# Patient Record
Sex: Female | Born: 1990 | Race: White | Hispanic: No | Marital: Single | Smoking: Former smoker
Health system: Southern US, Community
[De-identification: ages and names within clinical notes are randomized; demographics above are authoritative.]

## PROBLEM LIST (undated history)

## (undated) DIAGNOSIS — F329 Major depressive disorder, single episode, unspecified: Secondary | ICD-10-CM

## (undated) DIAGNOSIS — K5900 Constipation, unspecified: Secondary | ICD-10-CM

## (undated) DIAGNOSIS — M549 Dorsalgia, unspecified: Secondary | ICD-10-CM

## (undated) DIAGNOSIS — M7989 Other specified soft tissue disorders: Secondary | ICD-10-CM

## (undated) DIAGNOSIS — F419 Anxiety disorder, unspecified: Secondary | ICD-10-CM

## (undated) DIAGNOSIS — I10 Essential (primary) hypertension: Secondary | ICD-10-CM

## (undated) DIAGNOSIS — M2550 Pain in unspecified joint: Secondary | ICD-10-CM

## (undated) DIAGNOSIS — J45909 Unspecified asthma, uncomplicated: Secondary | ICD-10-CM

## (undated) DIAGNOSIS — R12 Heartburn: Secondary | ICD-10-CM

## (undated) DIAGNOSIS — F32A Depression, unspecified: Secondary | ICD-10-CM

## (undated) HISTORY — DX: Heartburn: R12

## (undated) HISTORY — DX: Unspecified asthma, uncomplicated: J45.909

## (undated) HISTORY — DX: Dorsalgia, unspecified: M54.9

## (undated) HISTORY — DX: Other specified soft tissue disorders: M79.89

## (undated) HISTORY — DX: Pain in unspecified joint: M25.50

## (undated) HISTORY — DX: Constipation, unspecified: K59.00

## (undated) HISTORY — DX: Essential (primary) hypertension: I10

---

## 2012-11-18 ENCOUNTER — Emergency Department (HOSPITAL_COMMUNITY)
Admission: EM | Admit: 2012-11-18 | Discharge: 2012-11-18 | Disposition: A | Discharge status: Home / Self Care | Payer: BC Managed Care – PPO | Attending: Emergency Medicine | Admitting: Emergency Medicine

## 2012-11-18 ENCOUNTER — Encounter (HOSPITAL_COMMUNITY): Payer: Self-pay | Admitting: Emergency Medicine

## 2012-11-18 ENCOUNTER — Emergency Department (HOSPITAL_COMMUNITY): Payer: BC Managed Care – PPO

## 2012-11-18 DIAGNOSIS — S298XXA Other specified injuries of thorax, initial encounter: Secondary | ICD-10-CM | POA: Insufficient documentation

## 2012-11-18 DIAGNOSIS — Z87891 Personal history of nicotine dependence: Secondary | ICD-10-CM | POA: Diagnosis not present

## 2012-11-18 DIAGNOSIS — R079 Chest pain, unspecified: Secondary | ICD-10-CM

## 2012-11-18 DIAGNOSIS — IMO0002 Reserved for concepts with insufficient information to code with codable children: Secondary | ICD-10-CM | POA: Insufficient documentation

## 2012-11-18 DIAGNOSIS — Y92410 Unspecified street and highway as the place of occurrence of the external cause: Secondary | ICD-10-CM | POA: Insufficient documentation

## 2012-11-18 DIAGNOSIS — Y9389 Activity, other specified: Secondary | ICD-10-CM | POA: Insufficient documentation

## 2012-11-18 DIAGNOSIS — S0990XA Unspecified injury of head, initial encounter: Secondary | ICD-10-CM | POA: Insufficient documentation

## 2012-11-18 DIAGNOSIS — M549 Dorsalgia, unspecified: Secondary | ICD-10-CM

## 2012-11-18 MED ORDER — IBUPROFEN 600 MG PO TABS: 600.0000 mg | ORAL_TABLET | Freq: Three times a day (TID) | ORAL | Status: DC | PRN

## 2012-11-18 MED ORDER — HYDROCODONE-ACETAMINOPHEN 5-325 MG PO TABS
1.0000 | ORAL_TABLET | Freq: Once | ORAL | Status: AC
  Administered 2012-11-18: 1 via ORAL
  Filled 2012-11-18: qty 1

## 2012-11-18 MED ORDER — HYDROCODONE-ACETAMINOPHEN 5-325 MG PO TABS: 1.0000 | ORAL_TABLET | Freq: Four times a day (QID) | ORAL | Status: DC | PRN
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
## 2012-11-18 NOTE — ED Notes (Signed)
C-collar removed by Dr. Patria Mane after his exam.
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
## 2012-11-18 NOTE — ED Notes (Signed)
Patient states that her head was hurting, but she is feeling ok,

## 2012-11-18 NOTE — ED Notes (Signed)
Pt ambulated to nurses station with no distress.

## 2016-04-24 ENCOUNTER — Emergency Department (HOSPITAL_COMMUNITY): Payer: Self-pay

## 2016-04-24 ENCOUNTER — Emergency Department (HOSPITAL_COMMUNITY)
Admission: EM | Admit: 2016-04-24 | Discharge: 2016-04-24 | Disposition: A | Discharge status: Home / Self Care | Payer: Self-pay | Attending: Emergency Medicine | Admitting: Emergency Medicine

## 2016-04-24 ENCOUNTER — Encounter (HOSPITAL_COMMUNITY): Payer: Self-pay | Admitting: Emergency Medicine

## 2016-04-24 DIAGNOSIS — R197 Diarrhea, unspecified: Secondary | ICD-10-CM

## 2016-04-24 DIAGNOSIS — Z87891 Personal history of nicotine dependence: Secondary | ICD-10-CM | POA: Insufficient documentation

## 2016-04-24 DIAGNOSIS — Z79899 Other long term (current) drug therapy: Secondary | ICD-10-CM | POA: Insufficient documentation

## 2016-04-24 DIAGNOSIS — K529 Noninfective gastroenteritis and colitis, unspecified: Secondary | ICD-10-CM | POA: Insufficient documentation

## 2016-04-24 DIAGNOSIS — R112 Nausea with vomiting, unspecified: Secondary | ICD-10-CM

## 2016-04-24 HISTORY — DX: Depression, unspecified: F32.A

## 2016-04-24 HISTORY — DX: Anxiety disorder, unspecified: F41.9

## 2016-04-24 HISTORY — DX: Major depressive disorder, single episode, unspecified: F32.9

## 2016-04-24 LAB — COMPREHENSIVE METABOLIC PANEL
ALT: 23 U/L (ref 14–54)
AST: 21 U/L (ref 15–41)
Albumin: 4.7 g/dL (ref 3.5–5.0)
Alkaline Phosphatase: 43 U/L (ref 38–126)
Anion gap: 11 (ref 5–15)
BUN: 14 mg/dL (ref 6–20)
CHLORIDE: 103 mmol/L (ref 101–111)
CO2: 21 mmol/L — ABNORMAL LOW (ref 22–32)
CREATININE: 0.66 mg/dL (ref 0.44–1.00)
Calcium: 9 mg/dL (ref 8.9–10.3)
Glucose, Bld: 109 mg/dL — ABNORMAL HIGH (ref 65–99)
Potassium: 3.7 mmol/L (ref 3.5–5.1)
Sodium: 135 mmol/L (ref 135–145)
Total Bilirubin: 1.3 mg/dL — ABNORMAL HIGH (ref 0.3–1.2)
Total Protein: 7.7 g/dL (ref 6.5–8.1)

## 2016-04-24 LAB — CBC WITH DIFFERENTIAL/PLATELET
Basophils Absolute: 0 10*3/uL (ref 0.0–0.1)
Basophils Relative: 0 %
Eosinophils Absolute: 0 10*3/uL (ref 0.0–0.7)
Eosinophils Relative: 0 %
HEMATOCRIT: 38.1 % (ref 36.0–46.0)
Hemoglobin: 13.3 g/dL (ref 12.0–15.0)
LYMPHS ABS: 0.7 10*3/uL (ref 0.7–4.0)
Lymphocytes Relative: 5 %
MCH: 28.4 pg (ref 26.0–34.0)
MCHC: 34.9 g/dL (ref 30.0–36.0)
MCV: 81.2 fL (ref 78.0–100.0)
MONOS PCT: 4 %
Monocytes Absolute: 0.6 10*3/uL (ref 0.1–1.0)
NEUTROS ABS: 13.1 10*3/uL — AB (ref 1.7–7.7)
Neutrophils Relative %: 91 %
Platelets: 230 10*3/uL (ref 150–400)
RBC: 4.69 MIL/uL (ref 3.87–5.11)
RDW: 12.5 % (ref 11.5–15.5)
WBC: 14.5 10*3/uL — ABNORMAL HIGH (ref 4.0–10.5)

## 2016-04-24 LAB — URINALYSIS, ROUTINE W REFLEX MICROSCOPIC
BILIRUBIN URINE: NEGATIVE
GLUCOSE, UA: NEGATIVE mg/dL
Hgb urine dipstick: NEGATIVE
Ketones, ur: NEGATIVE mg/dL
Leukocytes, UA: NEGATIVE
Nitrite: NEGATIVE
Protein, ur: NEGATIVE mg/dL
Specific Gravity, Urine: 1.025 (ref 1.005–1.030)
pH: 6 (ref 5.0–8.0)

## 2016-04-24 LAB — LIPASE, BLOOD: Lipase: 27 U/L (ref 11–51)

## 2016-04-24 LAB — PREGNANCY, URINE: Preg Test, Ur: NEGATIVE

## 2016-04-24 MED ORDER — SODIUM CHLORIDE 0.9 % IV BOLUS (SEPSIS)
1000.0000 mL | Freq: Once | INTRAVENOUS | Status: AC
  Administered 2016-04-24: 1000 mL via INTRAVENOUS

## 2016-04-24 MED ORDER — ONDANSETRON HCL 4 MG/2ML IJ SOLN
4.0000 mg | INTRAMUSCULAR | Status: DC | PRN
  Administered 2016-04-24: 4 mg via INTRAVENOUS
  Filled 2016-04-24: qty 2

## 2016-04-24 MED ORDER — PROMETHAZINE HCL 25 MG PO TABS: 25.0000 mg | ORAL_TABLET | Freq: Four times a day (QID) | ORAL | 0 refills | Status: DC | PRN

## 2016-04-24 MED ORDER — IOPAMIDOL (ISOVUE-300) INJECTION 61%
100.0000 mL | Freq: Once | INTRAVENOUS | Status: AC | PRN
  Administered 2016-04-24: 100 mL via INTRAVENOUS

## 2016-04-24 MED ORDER — FAMOTIDINE IN NACL 20-0.9 MG/50ML-% IV SOLN
20.0000 mg | Freq: Once | INTRAVENOUS | Status: AC
  Administered 2016-04-24: 20 mg via INTRAVENOUS
  Filled 2016-04-24: qty 50

## 2016-04-24 MED ORDER — METRONIDAZOLE 500 MG PO TABS: 500.0000 mg | ORAL_TABLET | Freq: Three times a day (TID) | ORAL | 0 refills | Status: DC

## 2016-04-24 MED ORDER — CIPROFLOXACIN HCL 500 MG PO TABS: 500.0000 mg | ORAL_TABLET | Freq: Two times a day (BID) | ORAL | 0 refills | Status: DC

## 2016-04-24 NOTE — ED Provider Notes (Signed)
[facility] Provider Note   CSN: [id number] Arrival date & time: 04/24/16  1613     History   Chief Complaint Chief Complaint  Patient presents with  . Abdominal Pain    HPI Margaret Walton is a 26 y.o. female.  HPI  Pt was seen at 1725. Per pt, c/o gradual onset and persistence of multiple intermittent episodes of N/V/D that began this morning. Has been associated with generalized abd and low back "aching" pain.  Describes the stools as "watery." Denies CP/SOB, no back pain, no fevers, no black or blood in stools or emesis.    Past Medical History:  Diagnosis Date  . Anxiety   . Depression     There are no active problems to display for this patient.   History reviewed. No pertinent surgical history.  OB History    Gravida Para Term Preterm AB Living   0 0 0 0 0 0   SAB TAB Ectopic Multiple Live Births   0 0 0 0 0       Home Medications    Prior to Admission medications   Medication Sig Start Date End Date Taking? Authorizing Provider  Drospirenone-Ethinyl Estradiol-Levomefol (BEYAZ) 3-0.02-0.451 MG tablet Take 1 tablet by mouth daily.    Historical Provider, MD  FLUoxetine (PROZAC) 40 MG capsule Take 40 mg by mouth daily.    Historical Provider, MD  HYDROcodone-acetaminophen (NORCO/VICODIN) 5-325 MG per tablet Take 1 tablet by mouth every 6 (six) hours as needed for moderate pain. 11/18/12   Azalia Bilis, MD  ibuprofen (ADVIL,MOTRIN) 200 MG tablet Take 800 mg by mouth daily as needed for mild pain.    Historical Provider, MD  ibuprofen (ADVIL,MOTRIN) 600 MG tablet Take 1 tablet (600 mg total) by mouth every 8 (eight) hours as needed. 11/18/12   Azalia Bilis, MD  pantoprazole (PROTONIX) 40 MG tablet Take 40 mg by mouth daily.    Historical Provider, MD    Family History History reviewed. No pertinent family history.  Social History Social History  Substance Use Topics  . Smoking status: Former Games developer  . Smokeless tobacco: Never Used  .  Alcohol use No     Allergies   Augmentin [amoxicillin-pot clavulanate] and Morphine and related   Review of Systems Review of Systems ROS: Statement: All systems negative except as marked or noted in the HPI; Constitutional: Negative for fever and chills. ; ; Eyes: Negative for eye pain, redness and discharge. ; ; ENMT: Negative for ear pain, hoarseness, nasal congestion, sinus pressure and sore throat. ; ; Cardiovascular: Negative for chest pain, palpitations, diaphoresis, dyspnea and peripheral edema. ; ; Respiratory: Negative for cough, wheezing and stridor. ; ; Gastrointestinal: +N/V/D, abd pain. Negative for blood in stool, hematemesis, jaundice and rectal bleeding. . ; ; Genitourinary: Negative for dysuria, flank pain and hematuria. ; ; Musculoskeletal: Negative for back pain and neck pain. Negative for swelling and trauma.; ; Skin: Negative for pruritus, rash, abrasions, blisters, bruising and skin lesion.; ; Neuro: Negative for headache, lightheadedness and neck stiffness. Negative for weakness, altered level of consciousness, altered mental status, extremity weakness, paresthesias, involuntary movement, seizure and syncope.       Physical Exam Updated Vital Signs BP 109/62   Pulse (!) 116   Temp 98.6 F (37 C) (Oral)   Resp 18   Ht  (1.575 m)   Wt 165 lb (74.8 kg)   LMP 04/03/2016   SpO2 98%   BMI 30.18 kg/m   Patient  Vitals for the past 24 hrs:  BP Temp Temp src Pulse Resp SpO2 Height Weight  04/24/16 2140 - 99.2 F (37.3 C) Oral (!) 113 - 100 % - -  04/24/16 2132 (!) 98/54 - - - - - - -  04/24/16 2121 - - - - - 99 % - -  04/24/16 2112 - - - (!) 105 - 90 % - -  04/24/16 2100 - - - (!) 109 - 100 % - -  04/24/16 2004 (!) 105/59 - - (!) 115 - 100 % - -  04/24/16 1944 101/60 - - (!) 116 16 100 % - -  04/24/16 1930 (!) 99/49 - - (!) 112 - 100 % - -  04/24/16 1900 (!) 99/52 - - (!) 105 - 100 % - -  04/24/16 1800 - - - 95 - 100 % - -  04/24/16 1628 - - - - - -   (1.575 m) 165 lb (74.8 kg)  04/24/16 1627 109/62 98.6 F (37 C) Oral (!) 116 18 98 % - -     Physical Exam 1730: Physical examination:  Nursing notes reviewed; Vital signs and O2 SAT reviewed;  Constitutional: Well developed, Well nourished, Well hydrated, Uncomfortable appearing.; Head:  Normocephalic, atraumatic; Eyes: EOMI, PERRL, No scleral icterus; ENMT: Mouth and pharynx normal, Mucous membranes moist; Neck: Supple, Full range of motion, No lymphadenopathy; Cardiovascular: Tachycardic rate and rhythm, No gallop; Respiratory: Breath sounds clear & equal bilaterally, No wheezes.  Speaking full sentences with ease, Normal respiratory effort/excursion; Chest: Nontender, Movement normal; Abdomen: Soft, +generalized tenderness to palp. No rebound or guarding. Nondistended, Normal bowel sounds; Genitourinary: No CVA tenderness; Extremities: Pulses normal, No tenderness, No edema, No calf edema or asymmetry.; Neuro: AA&Ox3, Major CN grossly intact.  Speech clear. No gross focal motor or sensory deficits in extremities.; Skin: Color normal, Warm, Dry.   ED Treatments / Results  Labs (all labs ordered are listed, but only abnormal results are displayed)   EKG  EKG Interpretation None       Radiology   Procedures Procedures (including critical care time)  Medications Ordered in ED Medications  sodium chloride 0.9 % bolus 1,000 mL (not administered)  famotidine (PEPCID) IVPB 20 mg premix (not administered)  ondansetron (ZOFRAN) injection 4 mg (not administered)     Initial Impression / Assessment and Plan / ED Course  I have reviewed the triage vital signs and the nursing notes.  Pertinent labs & imaging results that were available during my care of the patient were reviewed by me and considered in my medical decision making (see chart for details).  MDM Reviewed: previous chart, nursing note and vitals Reviewed previous: labs Interpretation: labs and CT scan    Results for  orders placed or performed during the hospital encounter of 04/24/16  Lipase, blood  Result Value Ref Range   Lipase 27 11 - 51 U/L  Comprehensive metabolic panel  Result Value Ref Range   Sodium 135 135 - 145 mmol/L   Potassium 3.7 3.5 - 5.1 mmol/L   Chloride 103 101 - 111 mmol/L   CO2 21 (L) 22 - 32 mmol/L   Glucose, Bld 109 (H) 65 - 99 mg/dL   BUN 14 6 - 20 mg/dL   Creatinine, Ser 0.98 0.44 - 1.00 mg/dL   Calcium 9.0 8.9 - 11.9 mg/dL   Total Protein 7.7 6.5 - 8.1 g/dL   Albumin 4.7 3.5 - 5.0 g/dL   AST 21 15 - 41 U/L  ALT 23 14 - 54 U/L   Alkaline Phosphatase 43 38 - 126 U/L   Total Bilirubin 1.3 (H) 0.3 - 1.2 mg/dL   GFR calc non Af Amer >60 >60 mL/min   GFR calc Af Amer >60 >60 mL/min   Anion gap 11 5 - 15  Urinalysis, Routine w reflex microscopic  Result Value Ref Range   Color, Urine YELLOW YELLOW   APPearance CLOUDY (A) CLEAR   Specific Gravity, Urine 1.025 1.005 - 1.030   pH 6.0 5.0 - 8.0   Glucose, UA NEGATIVE NEGATIVE mg/dL   Hgb urine dipstick NEGATIVE NEGATIVE   Bilirubin Urine NEGATIVE NEGATIVE   Ketones, ur NEGATIVE NEGATIVE mg/dL   Protein, ur NEGATIVE NEGATIVE mg/dL   Nitrite NEGATIVE NEGATIVE   Leukocytes, UA NEGATIVE NEGATIVE  Pregnancy, urine  Result Value Ref Range   Preg Test, Ur NEGATIVE NEGATIVE  CBC with Differential  Result Value Ref Range   WBC 14.5 (H) 4.0 - 10.5 K/uL   RBC 4.69 3.87 - 5.11 MIL/uL   Hemoglobin 13.3 12.0 - 15.0 g/dL   HCT 16.1 09.6 - 04.5 %   MCV 81.2 78.0 - 100.0 fL   MCH 28.4 26.0 - 34.0 pg   MCHC 34.9 30.0 - 36.0 g/dL   RDW 40.9 81.1 - 91.4 %   Platelets 230 150 - 400 K/uL   Neutrophils Relative % 91 %   Neutro Abs 13.1 (H) 1.7 - 7.7 K/uL   Lymphocytes Relative 5 %   Lymphs Abs 0.7 0.7 - 4.0 K/uL   Monocytes Relative 4 %   Monocytes Absolute 0.6 0.1 - 1.0 K/uL   Eosinophils Relative 0 %   Eosinophils Absolute 0.0 0.0 - 0.7 K/uL   Basophils Relative 0 %   Basophils Absolute 0.0 0.0 - 0.1 K/uL   Ct Abdomen  Pelvis W Contrast Result Date: 04/24/2016 CLINICAL DATA:  Acute onset of nausea, vomiting and diarrhea. Initial encounter. EXAM: CT ABDOMEN AND PELVIS WITH CONTRAST TECHNIQUE: Multidetector CT imaging of the abdomen and pelvis was performed using the standard protocol following bolus administration of intravenous contrast. CONTRAST:  ISOVUE-300 IOPAMIDOL (ISOVUE-300) INJECTION 61% COMPARISON:  None. FINDINGS: Lower chest: The visualized lung bases are grossly clear. The visualized portions of the mediastinum are unremarkable. Hepatobiliary: The liver is unremarkable in appearance. The gallbladder is unremarkable in appearance. The common bile duct remains normal in caliber. Pancreas: The pancreas is within normal limits. Spleen: The spleen is enlarged, measuring 14.7 cm in length. Adrenals/Urinary Tract: The adrenal glands are unremarkable in appearance. The kidneys are within normal limits. There is no evidence of hydronephrosis. No renal or ureteral stones are identified. No perinephric stranding is seen. Stomach/Bowel: The stomach is unremarkable in appearance. The small bowel is within normal limits. The appendix is normal in caliber, without evidence of appendicitis. There is suggestion of mild wall thickening along the the sigmoid colon, which may reflect a mild infectious or inflammatory process. The colon is otherwise unremarkable. Vascular/Lymphatic: The abdominal aorta is unremarkable in appearance. The inferior vena cava is grossly unremarkable. No retroperitoneal lymphadenopathy is seen. No pelvic sidewall lymphadenopathy is identified. Reproductive: The bladder is mildly distended and grossly unremarkable. The uterus is grossly unremarkable in appearance. Right adnexal cysts measure up to 2.8 cm in size, likely physiologic in nature given the patient's age. The left ovary is unremarkable in appearance. Other: No additional soft tissue abnormalities are seen. Musculoskeletal: No acute osseous  abnormalities are identified. The visualized musculature is unremarkable in  appearance. IMPRESSION: 1. Suggestion of mild wall thickening along the sigmoid colon, which may reflect a mild infectious or inflammatory process. 2. Splenomegaly noted. Electronically Signed   By: Roanna Raider M.D.   On: 04/24/2016 18:52    2150:  Pt has tol PO well while in the ED without N/V.  No stooling while in the ED.  Abd benign, resps easy. Pt states she feels better and wants to go home now. Unknown pt's baseline VS. Denies CP/palpitations/SOB. Doubt PE without c/o SOB and with low risk Well's. Will tx abx, f/u PMD. Dx and testing d/w pt and family.  Questions answered.  Verb understanding, agreeable to d/c home with outpt f/u.     Final Clinical Impressions(s) / ED Diagnoses   Final diagnoses:  None    New Prescriptions New Prescriptions   No medications on file     Samuel Jester, DO 04/28/16 1529

## 2016-04-24 NOTE — ED Triage Notes (Signed)
PT states n/v/d that started this morning around 0800. PT states vomiting x10 and diarrhea multiple times as swell. PT denies nay urinary symptoms.

## 2016-04-24 NOTE — Discharge Instructions (Signed)
Take the prescriptions as directed.  Increase your fluid intake (ie:  Gatoraide) for the next few days.  Eat a bland diet and advance to your regular diet slowly as you can tolerate it.   Avoid full strength juices, as well as milk and milk products until your diarrhea has resolved.   Call your regular medical doctor tomorrow to schedule a follow up appointment this week.  Return to the Emergency Department immediately if not improving (or even worsening) despite taking the medicines as prescribed, any black or bloody stool or vomit, if you develop a fever over "101," or for any other concerns. ° °

## 2016-04-24 NOTE — ED Notes (Signed)
Patient drinking ginger ale, tolerating well

## 2016-04-24 NOTE — ED Notes (Signed)
ED Provider at bedside. 

## 2016-04-24 NOTE — ED Notes (Signed)
To Ct 

## 2018-11-08 IMAGING — CT CT ABD-PELV W/ CM
2 of 4 series · 15 of 46 positions shown, 17 images · IV contrast (Isovue)
Comparison: None.

CLINICAL DATA: Acute onset of nausea, vomiting and diarrhea.
Initial encounter.

EXAM:
CT ABDOMEN AND PELVIS WITH CONTRAST
TECHNIQUE: Multidetector CT imaging of the abdomen and pelvis was performed
using the standard protocol following bolus administration of
intravenous contrast.
CONTRAST:  100mL [id number] IOPAMIDOL ([id number]) INJECTION 61%

[Series 2: axial st · axial · 0.63mm/px · z∈[+802,+1222]mm · 12 of 93 slices shown, 14 images]
[im 5/93  soft-tissue]
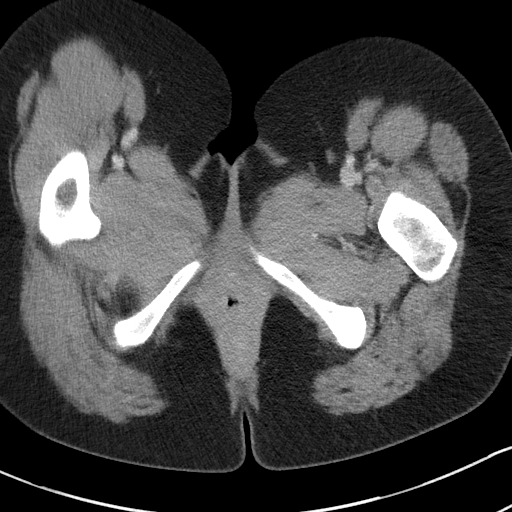
[im 5/93  bone]
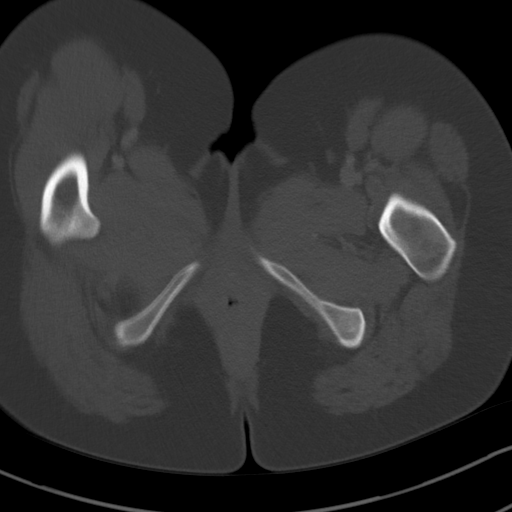
[im 13/93  soft-tissue]
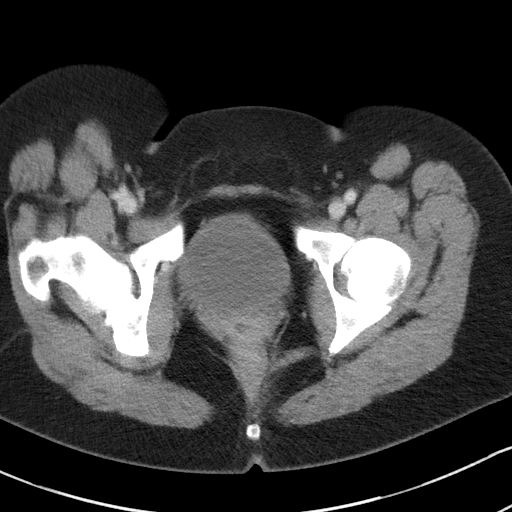
[im 21/93  soft-tissue]
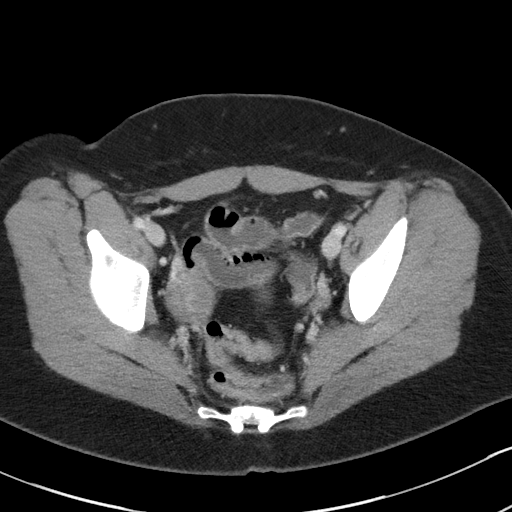
[im 29/93  soft-tissue]
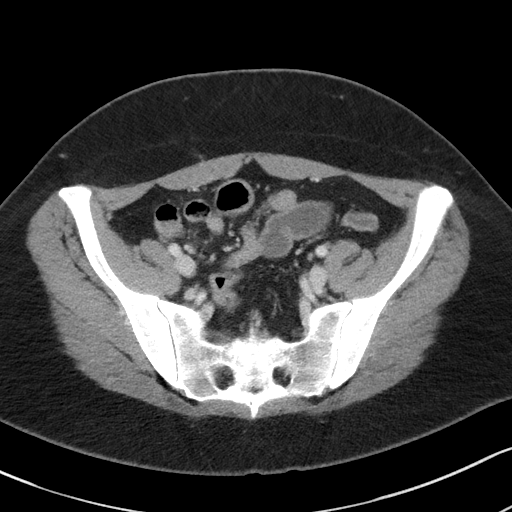
[im 37/93  soft-tissue]
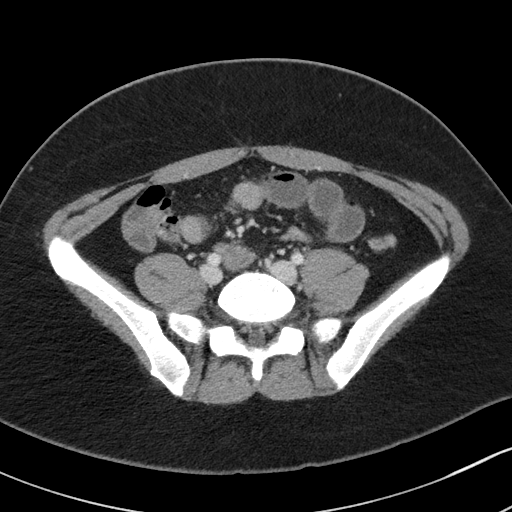
[im 45/93  soft-tissue]
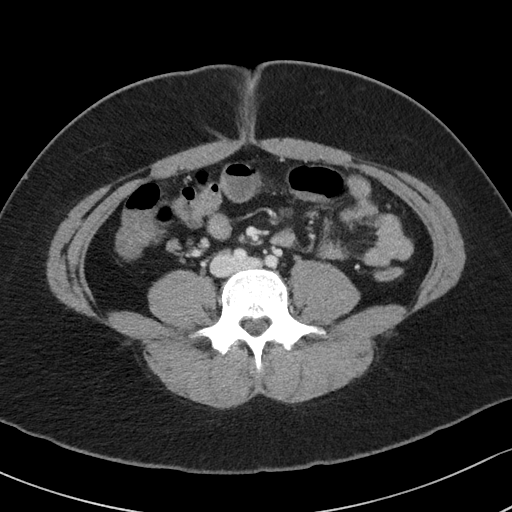
[im 49/93  soft-tissue]
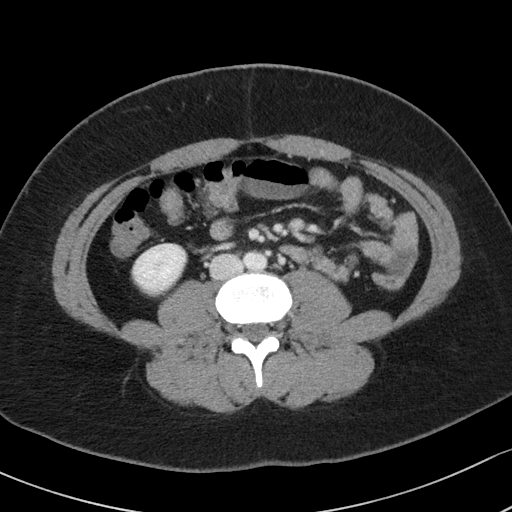
[im 57/93  soft-tissue]
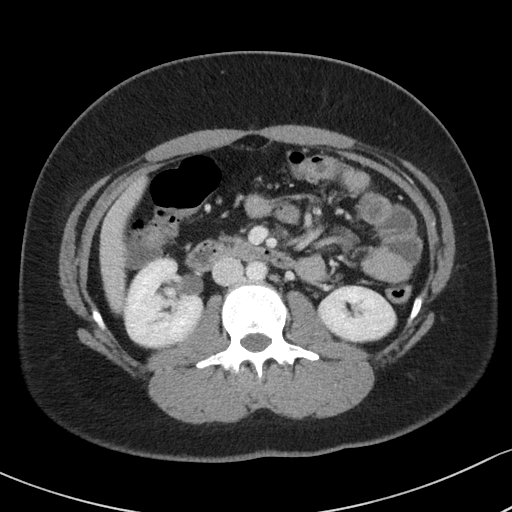
[im 65/93  soft-tissue]
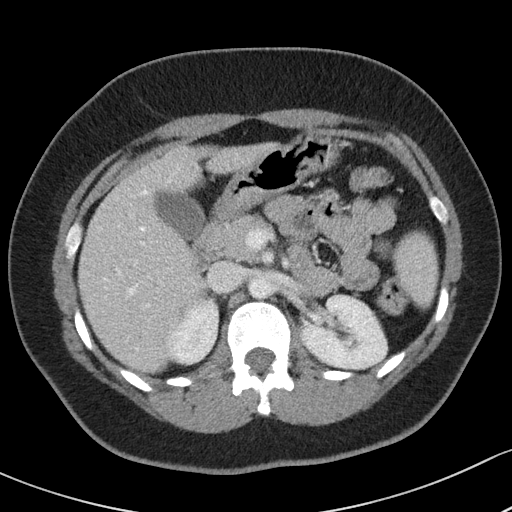
[im 65/93  bone]
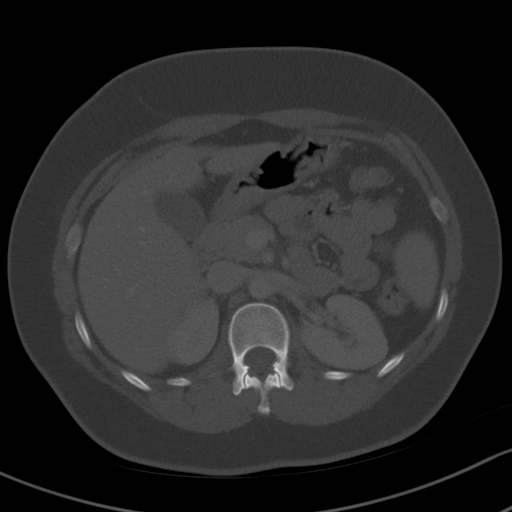
[im 73/93  soft-tissue]
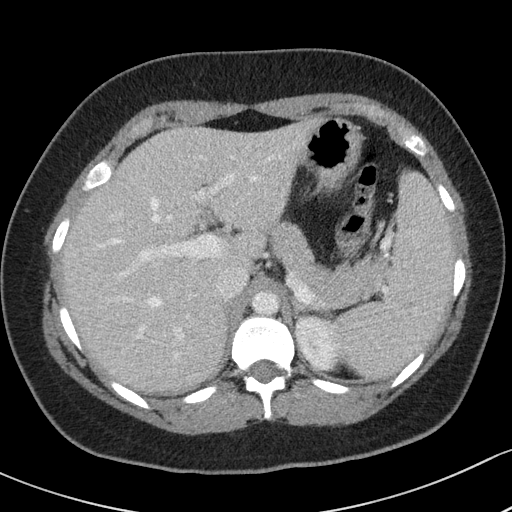
[im 81/93  soft-tissue]
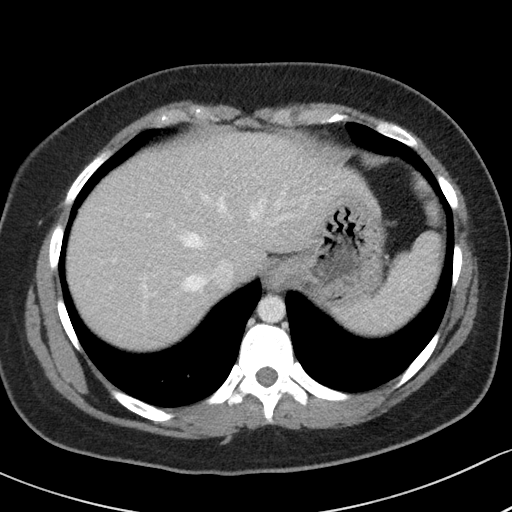
[im 89/93  soft-tissue]
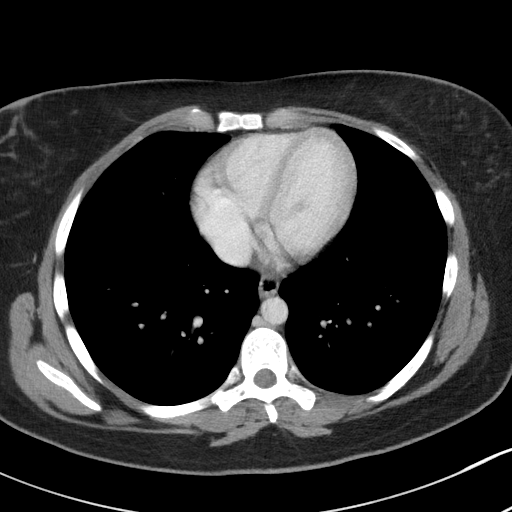

[Series 5: coronal st · coronal · 0.70mm/px · 3 of 91 slices shown]
[im 31/91  soft-tissue]
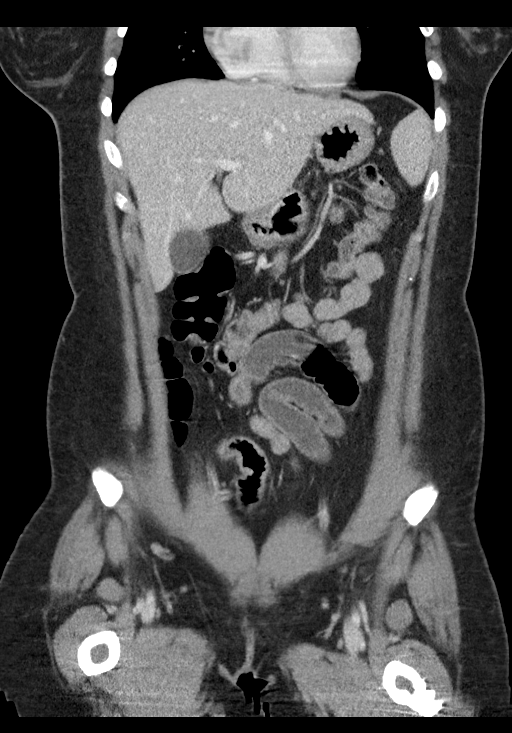
[im 41/91  soft-tissue]
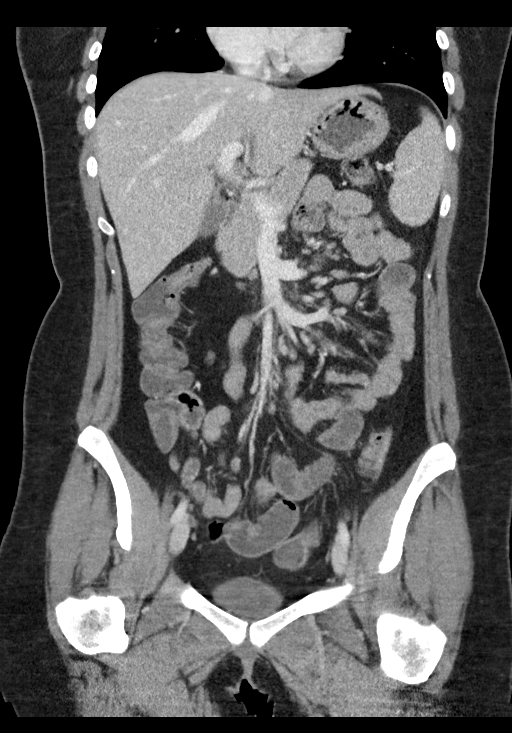
[im 51/91  soft-tissue]
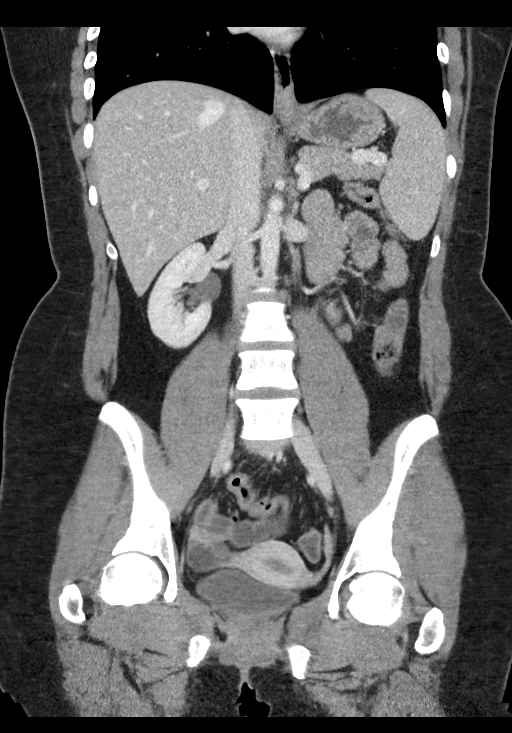

[15 of 46 positions shown; findings below may reference images not displayed]

FINDINGS: Lower chest: The visualized lung bases are grossly clear. The
visualized portions of the mediastinum are unremarkable.

Hepatobiliary: The liver is unremarkable in appearance. The
gallbladder is unremarkable in appearance. The common bile duct
remains normal in caliber.

Pancreas: The pancreas is within normal limits.

Spleen: The spleen is enlarged, measuring 14.7 cm in length.

Adrenals/Urinary Tract: The adrenal glands are unremarkable in
appearance. The kidneys are within normal limits. There is no
evidence of hydronephrosis. No renal or ureteral stones are
identified. No perinephric stranding is seen.

Stomach/Bowel: The stomach is unremarkable in appearance. The small
bowel is within normal limits. The appendix is normal in caliber,
without evidence of appendicitis.

There is suggestion of mild wall thickening along the the sigmoid
colon, which may reflect a mild infectious or inflammatory process.
The colon is otherwise unremarkable.

Vascular/Lymphatic: The abdominal aorta is unremarkable in
appearance. The inferior vena cava is grossly unremarkable. No
retroperitoneal lymphadenopathy is seen. No pelvic sidewall
lymphadenopathy is identified.

Reproductive: The bladder is mildly distended and grossly
unremarkable. The uterus is grossly unremarkable in appearance.
Right adnexal cysts measure up to 2.8 cm in size, likely physiologic
in nature given the patient's age. The left ovary is unremarkable in
appearance.

Other: No additional soft tissue abnormalities are seen.

Musculoskeletal: No acute osseous abnormalities are identified. The
visualized musculature is unremarkable in appearance.
IMPRESSION: 1. Suggestion of mild wall thickening along the sigmoid colon, which
may reflect a mild infectious or inflammatory process.
2. Splenomegaly noted.

## 2019-06-21 ENCOUNTER — Encounter (HOSPITAL_COMMUNITY): Payer: Self-pay | Admitting: Emergency Medicine

## 2019-06-21 ENCOUNTER — Other Ambulatory Visit: Payer: Self-pay

## 2019-06-21 ENCOUNTER — Emergency Department (HOSPITAL_COMMUNITY)
Admission: EM | Admit: 2019-06-21 | Discharge: 2019-06-21 | Disposition: A | Discharge status: Home / Self Care | Payer: Commercial Managed Care - PPO | Attending: Emergency Medicine | Admitting: Emergency Medicine

## 2019-06-21 DIAGNOSIS — Z87891 Personal history of nicotine dependence: Secondary | ICD-10-CM | POA: Insufficient documentation

## 2019-06-21 DIAGNOSIS — Z3A01 Less than 8 weeks gestation of pregnancy: Secondary | ICD-10-CM | POA: Insufficient documentation

## 2019-06-21 DIAGNOSIS — Z880 Allergy status to penicillin: Secondary | ICD-10-CM | POA: Diagnosis not present

## 2019-06-21 DIAGNOSIS — Z886 Allergy status to analgesic agent status: Secondary | ICD-10-CM | POA: Diagnosis not present

## 2019-06-21 DIAGNOSIS — O26891 Other specified pregnancy related conditions, first trimester: Secondary | ICD-10-CM | POA: Insufficient documentation

## 2019-06-21 DIAGNOSIS — R1012 Left upper quadrant pain: Secondary | ICD-10-CM | POA: Insufficient documentation

## 2019-06-21 DIAGNOSIS — Z3491 Encounter for supervision of normal pregnancy, unspecified, first trimester: Secondary | ICD-10-CM

## 2019-06-21 LAB — BASIC METABOLIC PANEL
Anion gap: 11 (ref 5–15)
BUN: 10 mg/dL (ref 6–20)
CO2: 19 mmol/L — ABNORMAL LOW (ref 22–32)
Calcium: 8.9 mg/dL (ref 8.9–10.3)
Chloride: 105 mmol/L (ref 98–111)
Creatinine, Ser: 0.73 mg/dL (ref 0.44–1.00)
GFR calc Af Amer: >60 mL/min (ref >60)
GFR calc non Af Amer: >60 mL/min (ref >60)
Glucose, Bld: 137 mg/dL — ABNORMAL HIGH (ref 70–99)
Potassium: 3.7 mmol/L (ref 3.5–5.1)
Sodium: 135 mmol/L (ref 135–145)

## 2019-06-21 LAB — CBC WITH DIFFERENTIAL/PLATELET
Abs Immature Granulocytes: 0.1 10*3/uL — ABNORMAL HIGH (ref 0.00–0.07)
Basophils Absolute: 0.1 10*3/uL (ref 0.0–0.1)
Basophils Relative: 1 %
Eosinophils Absolute: 0.4 10*3/uL (ref 0.0–0.5)
Eosinophils Relative: 3 %
HCT: 36.8 % (ref 36.0–46.0)
Hemoglobin: 12.6 g/dL (ref 12.0–15.0)
Immature Granulocytes: 1 %
Lymphocytes Relative: 17 %
Lymphs Abs: 2 10*3/uL (ref 0.7–4.0)
MCH: 27.8 pg (ref 26.0–34.0)
MCHC: 34.2 g/dL (ref 30.0–36.0)
MCV: 81.1 fL (ref 80.0–100.0)
Monocytes Absolute: 0.6 10*3/uL (ref 0.1–1.0)
Monocytes Relative: 5 %
Neutro Abs: 8.7 10*3/uL — ABNORMAL HIGH (ref 1.7–7.7)
Neutrophils Relative %: 73 %
Platelets: 315 10*3/uL (ref 150–400)
RBC: 4.54 MIL/uL (ref 3.87–5.11)
RDW: 11.9 % (ref 11.5–15.5)
WBC: 11.7 10*3/uL — ABNORMAL HIGH (ref 4.0–10.5)
nRBC: 0 % (ref 0.0–0.2)

## 2019-06-21 LAB — HCG, QUANTITATIVE, PREGNANCY: hCG, Beta Chain, Quant, S: 29896 m[IU]/mL — ABNORMAL HIGH (ref <5)

## 2019-06-21 LAB — URINALYSIS, ROUTINE W REFLEX MICROSCOPIC
Bilirubin Urine: NEGATIVE
Glucose, UA: NEGATIVE mg/dL
Hgb urine dipstick: NEGATIVE
Ketones, ur: NEGATIVE mg/dL
Leukocytes,Ua: NEGATIVE
Nitrite: NEGATIVE
Protein, ur: NEGATIVE mg/dL
Specific Gravity, Urine: 1.023 (ref 1.005–1.030)
pH: 5 (ref 5.0–8.0)

## 2019-06-21 NOTE — ED Triage Notes (Signed)
[facility] there and was suggested to come here as they have no [place] on weekends   Due date 3/22 Has appt in 2 weeks  This is first pregnancy   Here due to R LQ pain

## 2019-06-21 NOTE — ED Notes (Signed)
Pt verbalized no blood noted, hurts mostly while standing

## 2019-06-21 NOTE — Discharge Instructions (Addendum)
Your blood work today was reassuring. It is important that you follow-up with your OB/GYN as scheduled. Return to the emergency department if you develop any symptoms such as lower abdominal pain, vaginal bleeding, fever or vomiting.

## 2019-06-23 NOTE — ED Provider Notes (Signed)
[facility] Provider Note   CSN: [id number] Arrival date & time: 06/21/19  1127     History Chief Complaint  Patient presents with  . Abdominal Pain    Margaret Walton is a 29 y.o. female.  HPI      Margaret Walton is a 29 y.o. female G1P0 with LMP 05/07/19 who presents to the Emergency Department complaining of left upper and left lateral abdominal pain.  Symptoms have been waxing and waning for several weeks.  She describes a burning sensation to her left abdomen that worsens with food intake and is relieved by Prilosec.  She states her OB is the [facility] in [place].  She contacted the hospital there and was advised to come to [facility] for further evaluation as they did not have ultrasound availability at this time.  She has an upcoming prenatal visit in 2 weeks with her OB.  She denies fever, chills, vomiting, dysuria, lower abdominal pain and vaginal bleeding or spotting.    Past Medical History:  Diagnosis Date  . Anxiety   . Depression     There are no problems to display for this patient.   History reviewed. No pertinent surgical history.   OB History    Gravida  0   Para  0   Term  0   Preterm  0   AB  0   Living  0     SAB  0   TAB  0   Ectopic  0   Multiple  0   Live Births  0           No family history on file.  Social History   Tobacco Use  . Smoking status: Former Research scientist (life sciences)  . Smokeless tobacco: Never Used  Substance Use Topics  . Alcohol use: No  . Drug use: No    Home Medications Prior to Admission medications   Medication Sig Start Date End Date Taking? Authorizing Provider  ciprofloxacin (CIPRO) 500 MG tablet Take 1 tablet (500 mg total) by mouth 2 (two) times daily. 04/24/16   Francine Graven, DO  escitalopram (LEXAPRO) 10 MG tablet Take 10 mg by mouth at bedtime.    [provider]  metroNIDAZOLE (FLAGYL) 500 MG tablet Take 1 tablet (500 mg total) by mouth 3 (three)  times daily. 04/24/16   Francine Graven, DO  promethazine (PHENERGAN) 25 MG tablet Take 1 tablet (25 mg total) by mouth every 6 (six) hours as needed for nausea or vomiting. 04/24/16   Francine Graven, DO    Allergies    Augmentin [amoxicillin-pot clavulanate] and Morphine and related  Review of Systems   Review of Systems  Constitutional: Negative for chills, fatigue and fever.  HENT: Negative for sore throat and trouble swallowing.   Respiratory: Negative for cough and shortness of breath.   Cardiovascular: Negative for chest pain.  Gastrointestinal: Positive for abdominal pain. Negative for blood in stool, diarrhea, nausea and vomiting.  Genitourinary: Negative for decreased urine volume, dysuria, flank pain, hematuria, pelvic pain, vaginal bleeding and vaginal discharge.  Musculoskeletal: Negative for arthralgias, back pain, myalgias, neck pain and neck stiffness.  Skin: Negative for rash.  Neurological: Negative for dizziness, weakness and numbness.  Hematological: Does not bruise/bleed easily.    Physical Exam Updated Vital Signs BP 118/73   Pulse (!) 104   Temp 98.5 F (36.9 C) (Oral)   Resp 18   Ht 5\' 1"  (1.549 m)   Wt 87.2  kg   LMP 05/07/2019 Comment: due date 3/22  SpO2 100%   BMI 36.32 kg/m   Physical Exam Constitutional:      General: She is not in acute distress.    Appearance: Normal appearance. She is not ill-appearing.  HENT:     Head: Normocephalic.     Mouth/Throat:     Mouth: Mucous membranes are moist.  Neck:     Thyroid: No thyromegaly.     Meningeal: Kernig's sign absent.  Cardiovascular:     Rate and Rhythm: Normal rate and regular rhythm.     Pulses: Normal pulses.  Pulmonary:     Effort: Pulmonary effort is normal.     Breath sounds: Normal breath sounds. No wheezing.  Chest:     Chest wall: No tenderness.  Abdominal:     General: There is no distension.     Palpations: Abdomen is soft.     Tenderness: There is no abdominal  tenderness. There is no right CVA tenderness, left CVA tenderness, guarding or rebound.  Musculoskeletal:        General: Normal range of motion.     Right lower leg: No edema.     Left lower leg: No edema.  Skin:    General: Skin is warm.     Findings: No erythema or rash.  Neurological:     General: No focal deficit present.     Mental Status: She is alert.     Sensory: No sensory deficit.     Motor: No weakness.     ED Results / Procedures / Treatments   Labs (all labs ordered are listed, but only abnormal results are displayed) Labs Reviewed  HCG, QUANTITATIVE, PREGNANCY - Abnormal; Notable for the following components:      Result Value   hCG, Beta Chain, Quant, S 29,896 (*)    All other components within normal limits  URINALYSIS, ROUTINE W REFLEX MICROSCOPIC - Abnormal; Notable for the following components:   APPearance HAZY (*)    All other components within normal limits  CBC WITH DIFFERENTIAL/PLATELET - Abnormal; Notable for the following components:   WBC 11.7 (*)    Neutro Abs 8.7 (*)    Abs Immature Granulocytes 0.10 (*)    All other components within normal limits  BASIC METABOLIC PANEL - Abnormal; Notable for the following components:   CO2 19 (*)    Glucose, Bld 137 (*)    All other components within normal limits    EKG None  Radiology No results found.  Procedures Procedures (including critical care time)  Medications Ordered in ED Medications - No data to display  ED Course  I have reviewed the triage vital signs and the nursing notes.  Pertinent labs & imaging results that were available during my care of the patient were reviewed by me and considered in my medical decision making (see chart for details).    MDM Rules/Calculators/A&P                           Patient who is approximately [redacted] weeks pregnant by LMP here for evaluation of left upper and left lateral abdominal pain.  She describes pain as a burning sensation that is worsened  with intake of certain foods.  She has been taking Prilosec with relief.  She is well-appearing.  Vital signs are reviewed.  On exam, her abdomen is soft and nontender.  There is no CVA tenderness.  She denies  any vaginal bleeding or spotting.  Quantitative hCG is reassuring.  Symptoms are felt to be related to acid reflux.  I am doubtful that this is related to a ectopic pregnancy, TOA, or threatened AB, and do not feel that emergent ultrasound is needed at this time.  Patient also seen by Dr. Renaye Rakers.  It is felt that patient is appropriate for discharge home she will continue to take her PPI.  She also agrees to close follow-up with her OB on Monday.  Strict return precautions were discussed.   Final Clinical Impression(s) / ED Diagnoses Final diagnoses:  Left upper quadrant abdominal pain  Pregnant and not yet delivered in first trimester    Rx / DC Orders ED Discharge Orders    None       Pauline Aus, PA-C 06/23/19 1252    Terald Sleeper, MD 06/23/19 1753

## 2020-01-09 DIAGNOSIS — F419 Anxiety disorder, unspecified: Secondary | ICD-10-CM | POA: Diagnosis not present

## 2020-01-09 DIAGNOSIS — F320 Major depressive disorder, single episode, mild: Secondary | ICD-10-CM | POA: Diagnosis not present

## 2020-01-29 DIAGNOSIS — Z8759 Personal history of other complications of pregnancy, childbirth and the puerperium: Secondary | ICD-10-CM | POA: Diagnosis not present

## 2020-01-29 DIAGNOSIS — Z20822 Contact with and (suspected) exposure to covid-19: Secondary | ICD-10-CM | POA: Diagnosis not present

## 2020-01-29 DIAGNOSIS — M791 Myalgia, unspecified site: Secondary | ICD-10-CM | POA: Diagnosis not present

## 2020-01-29 DIAGNOSIS — O99893 Other specified diseases and conditions complicating puerperium: Secondary | ICD-10-CM | POA: Diagnosis not present

## 2020-01-29 DIAGNOSIS — R519 Headache, unspecified: Secondary | ICD-10-CM | POA: Diagnosis not present

## 2020-01-29 DIAGNOSIS — O9089 Other complications of the puerperium, not elsewhere classified: Secondary | ICD-10-CM | POA: Diagnosis not present

## 2020-02-03 DIAGNOSIS — F32A Depression, unspecified: Secondary | ICD-10-CM | POA: Diagnosis not present

## 2020-02-03 DIAGNOSIS — Z634 Disappearance and death of family member: Secondary | ICD-10-CM | POA: Diagnosis not present

## 2020-02-03 DIAGNOSIS — F53 Postpartum depression: Secondary | ICD-10-CM | POA: Diagnosis not present

## 2020-02-03 DIAGNOSIS — O1415 Severe pre-eclampsia, complicating the puerperium: Secondary | ICD-10-CM | POA: Diagnosis not present

## 2020-02-03 DIAGNOSIS — Z8759 Personal history of other complications of pregnancy, childbirth and the puerperium: Secondary | ICD-10-CM | POA: Diagnosis not present

## 2020-02-03 DIAGNOSIS — F4329 Adjustment disorder with other symptoms: Secondary | ICD-10-CM | POA: Diagnosis not present

## 2020-02-03 DIAGNOSIS — O99345 Other mental disorders complicating the puerperium: Secondary | ICD-10-CM | POA: Diagnosis not present

## 2020-02-03 DIAGNOSIS — Z30430 Encounter for insertion of intrauterine contraceptive device: Secondary | ICD-10-CM | POA: Diagnosis not present

## 2020-03-15 DIAGNOSIS — R350 Frequency of micturition: Secondary | ICD-10-CM | POA: Diagnosis not present

## 2020-04-08 DIAGNOSIS — F320 Major depressive disorder, single episode, mild: Secondary | ICD-10-CM | POA: Diagnosis not present

## 2020-04-08 DIAGNOSIS — F419 Anxiety disorder, unspecified: Secondary | ICD-10-CM | POA: Diagnosis not present

## 2020-05-03 DIAGNOSIS — F320 Major depressive disorder, single episode, mild: Secondary | ICD-10-CM | POA: Diagnosis not present

## 2020-05-03 DIAGNOSIS — E6609 Other obesity due to excess calories: Secondary | ICD-10-CM | POA: Diagnosis not present

## 2020-05-03 DIAGNOSIS — F419 Anxiety disorder, unspecified: Secondary | ICD-10-CM | POA: Diagnosis not present

## 2020-05-22 DIAGNOSIS — R059 Cough, unspecified: Secondary | ICD-10-CM | POA: Diagnosis not present

## 2020-05-22 DIAGNOSIS — Z20828 Contact with and (suspected) exposure to other viral communicable diseases: Secondary | ICD-10-CM | POA: Diagnosis not present

## 2020-06-01 DIAGNOSIS — E6609 Other obesity due to excess calories: Secondary | ICD-10-CM | POA: Diagnosis not present

## 2020-06-01 DIAGNOSIS — F320 Major depressive disorder, single episode, mild: Secondary | ICD-10-CM | POA: Diagnosis not present

## 2020-06-01 DIAGNOSIS — F419 Anxiety disorder, unspecified: Secondary | ICD-10-CM | POA: Diagnosis not present

## 2020-07-14 DIAGNOSIS — F419 Anxiety disorder, unspecified: Secondary | ICD-10-CM | POA: Diagnosis not present

## 2020-07-14 DIAGNOSIS — F32A Depression, unspecified: Secondary | ICD-10-CM | POA: Diagnosis not present

## 2020-07-14 DIAGNOSIS — Z0000 Encounter for general adult medical examination without abnormal findings: Secondary | ICD-10-CM | POA: Diagnosis not present

## 2020-07-19 DIAGNOSIS — Z20828 Contact with and (suspected) exposure to other viral communicable diseases: Secondary | ICD-10-CM | POA: Diagnosis not present

## 2020-07-20 DIAGNOSIS — Z20822 Contact with and (suspected) exposure to covid-19: Secondary | ICD-10-CM | POA: Diagnosis not present

## 2020-07-20 DIAGNOSIS — Z20828 Contact with and (suspected) exposure to other viral communicable diseases: Secondary | ICD-10-CM | POA: Diagnosis not present

## 2020-07-27 DIAGNOSIS — J019 Acute sinusitis, unspecified: Secondary | ICD-10-CM | POA: Diagnosis not present

## 2020-08-19 DIAGNOSIS — H5213 Myopia, bilateral: Secondary | ICD-10-CM | POA: Diagnosis not present

## 2020-10-07 DIAGNOSIS — G43909 Migraine, unspecified, not intractable, without status migrainosus: Secondary | ICD-10-CM | POA: Diagnosis not present

## 2020-10-12 DIAGNOSIS — R509 Fever, unspecified: Secondary | ICD-10-CM | POA: Diagnosis not present

## 2020-10-12 DIAGNOSIS — R059 Cough, unspecified: Secondary | ICD-10-CM | POA: Diagnosis not present

## 2020-11-12 DIAGNOSIS — T8389XA Other specified complication of genitourinary prosthetic devices, implants and grafts, initial encounter: Secondary | ICD-10-CM | POA: Diagnosis not present

## 2020-11-12 DIAGNOSIS — Z6839 Body mass index (BMI) 39.0-39.9, adult: Secondary | ICD-10-CM | POA: Diagnosis not present

## 2020-11-12 DIAGNOSIS — R635 Abnormal weight gain: Secondary | ICD-10-CM | POA: Diagnosis not present

## 2020-11-12 DIAGNOSIS — Z01411 Encounter for gynecological examination (general) (routine) with abnormal findings: Secondary | ICD-10-CM | POA: Diagnosis not present

## 2020-11-12 DIAGNOSIS — Z30433 Encounter for removal and reinsertion of intrauterine contraceptive device: Secondary | ICD-10-CM | POA: Diagnosis not present

## 2020-11-15 DIAGNOSIS — Z30432 Encounter for removal of intrauterine contraceptive device: Secondary | ICD-10-CM | POA: Diagnosis not present

## 2020-12-10 DIAGNOSIS — F419 Anxiety disorder, unspecified: Secondary | ICD-10-CM | POA: Diagnosis not present

## 2020-12-10 DIAGNOSIS — F320 Major depressive disorder, single episode, mild: Secondary | ICD-10-CM | POA: Diagnosis not present

## 2020-12-10 DIAGNOSIS — E6609 Other obesity due to excess calories: Secondary | ICD-10-CM | POA: Diagnosis not present

## 2021-02-11 DIAGNOSIS — E559 Vitamin D deficiency, unspecified: Secondary | ICD-10-CM | POA: Diagnosis not present

## 2021-02-11 DIAGNOSIS — R635 Abnormal weight gain: Secondary | ICD-10-CM | POA: Diagnosis not present

## 2021-02-11 DIAGNOSIS — Z13220 Encounter for screening for lipoid disorders: Secondary | ICD-10-CM | POA: Diagnosis not present

## 2021-02-11 DIAGNOSIS — Z6838 Body mass index (BMI) 38.0-38.9, adult: Secondary | ICD-10-CM | POA: Diagnosis not present

## 2021-02-11 DIAGNOSIS — Z131 Encounter for screening for diabetes mellitus: Secondary | ICD-10-CM | POA: Diagnosis not present

## 2021-02-11 DIAGNOSIS — E669 Obesity, unspecified: Secondary | ICD-10-CM | POA: Diagnosis not present

## 2021-03-14 DIAGNOSIS — J4522 Mild intermittent asthma with status asthmaticus: Secondary | ICD-10-CM | POA: Diagnosis not present

## 2021-03-14 DIAGNOSIS — H6691 Otitis media, unspecified, right ear: Secondary | ICD-10-CM | POA: Diagnosis not present

## 2021-03-14 DIAGNOSIS — Z6837 Body mass index (BMI) 37.0-37.9, adult: Secondary | ICD-10-CM | POA: Diagnosis not present

## 2021-03-14 DIAGNOSIS — J069 Acute upper respiratory infection, unspecified: Secondary | ICD-10-CM | POA: Diagnosis not present

## 2021-04-26 DIAGNOSIS — Z6837 Body mass index (BMI) 37.0-37.9, adult: Secondary | ICD-10-CM | POA: Diagnosis not present

## 2021-04-26 DIAGNOSIS — R5383 Other fatigue: Secondary | ICD-10-CM | POA: Diagnosis not present

## 2021-04-26 DIAGNOSIS — E162 Hypoglycemia, unspecified: Secondary | ICD-10-CM | POA: Diagnosis not present

## 2021-04-26 DIAGNOSIS — I10 Essential (primary) hypertension: Secondary | ICD-10-CM | POA: Diagnosis not present

## 2021-07-12 DIAGNOSIS — E6609 Other obesity due to excess calories: Secondary | ICD-10-CM | POA: Diagnosis not present

## 2021-07-12 DIAGNOSIS — Z6838 Body mass index (BMI) 38.0-38.9, adult: Secondary | ICD-10-CM | POA: Diagnosis not present

## 2021-07-12 DIAGNOSIS — E538 Deficiency of other specified B group vitamins: Secondary | ICD-10-CM | POA: Diagnosis not present

## 2021-07-12 DIAGNOSIS — F419 Anxiety disorder, unspecified: Secondary | ICD-10-CM | POA: Diagnosis not present

## 2021-07-12 DIAGNOSIS — R030 Elevated blood-pressure reading, without diagnosis of hypertension: Secondary | ICD-10-CM | POA: Diagnosis not present

## 2022-02-09 DIAGNOSIS — J069 Acute upper respiratory infection, unspecified: Secondary | ICD-10-CM | POA: Diagnosis not present

## 2022-02-09 DIAGNOSIS — R030 Elevated blood-pressure reading, without diagnosis of hypertension: Secondary | ICD-10-CM | POA: Diagnosis not present

## 2022-02-09 DIAGNOSIS — Z20828 Contact with and (suspected) exposure to other viral communicable diseases: Secondary | ICD-10-CM | POA: Diagnosis not present

## 2022-02-09 DIAGNOSIS — J019 Acute sinusitis, unspecified: Secondary | ICD-10-CM | POA: Diagnosis not present

## 2022-04-19 ENCOUNTER — Ambulatory Visit: Payer: 59 | Admitting: Urology

## 2022-05-18 ENCOUNTER — Encounter (INDEPENDENT_AMBULATORY_CARE_PROVIDER_SITE_OTHER): Payer: 59 | Admitting: Internal Medicine

## 2022-05-24 ENCOUNTER — Ambulatory Visit (INDEPENDENT_AMBULATORY_CARE_PROVIDER_SITE_OTHER): Payer: 59 | Admitting: Family Medicine

## 2022-05-24 ENCOUNTER — Encounter (INDEPENDENT_AMBULATORY_CARE_PROVIDER_SITE_OTHER): Payer: Self-pay | Admitting: Family Medicine

## 2022-05-24 VITALS — BP 120/83 | HR 87 | Temp 98.6°F | Ht 62.0 in | Wt 210.0 lb

## 2022-05-24 DIAGNOSIS — Z6838 Body mass index (BMI) 38.0-38.9, adult: Secondary | ICD-10-CM

## 2022-05-24 DIAGNOSIS — E669 Obesity, unspecified: Secondary | ICD-10-CM | POA: Diagnosis not present

## 2022-05-24 DIAGNOSIS — Z0289 Encounter for other administrative examinations: Secondary | ICD-10-CM

## 2022-05-24 DIAGNOSIS — K219 Gastro-esophageal reflux disease without esophagitis: Secondary | ICD-10-CM | POA: Diagnosis not present

## 2022-05-24 DIAGNOSIS — E66812 Obesity, class 2: Secondary | ICD-10-CM

## 2022-05-24 DIAGNOSIS — R635 Abnormal weight gain: Secondary | ICD-10-CM | POA: Insufficient documentation

## 2022-05-24 NOTE — Assessment & Plan Note (Signed)
Answered questions that patient had concerning vertical sleeve gastrectomy.  She has attended a surgical seminar with [facility] surgery and is considering vertical sleeve gastrectomy.  Her current BMI is 38 and the only listed comorbidity is GERD.  She does have elevated liver enzymes with no confirmed diagnosis of fatty liver disease.  She will return for a new patient visit to obtain labs.  If liver enzymes remain elevated, we will obtain a liver ultrasound to look for fatty liver disease.  Also consider obtaining a polysomnogram to look for underlying sleep apnea.  She does lack insurance coverage for incretin therapy.

## 2022-05-24 NOTE — Assessment & Plan Note (Signed)
She reports taking omeprazole 20 mg OTC.  She is not taking this on a daily basis.  It has previously helped with acid reflux symptoms.  She denies a history of GI ulcers.  Look for improvements in GERD with weight reduction.  Avoid late night eating and high acid foods.

## 2022-05-24 NOTE — Assessment & Plan Note (Signed)
Reviewed patient's weight history.  She gained most of her weight following a pregnancy loss with preeclampsia.  She has a good support system at home.  She is motivated to attain a healthy weight and try again for a future pregnancy.  She is currently on birth control.  She is motivated to continue gym workouts 2 days a week.  Will plan to obtain a fasting IC and fasting labs next visit. Limit intake of high sugar foods and drinks and work on eating on a schedule.

## 2022-05-24 NOTE — Progress Notes (Signed)
Office: [phone]  /  Fax: [phone]   Initial Visit  Margaret Walton was seen in clinic today to evaluate for obesity. She is interested in losing weight to improve overall health and reduce the risk of weight related complications. She presents today to review program treatment options, initial physical assessment, and evaluation.     She was referred by: Self-Referral  When asked what else they would like to accomplish? She states: Improve quality of life and Improve appearance  Weight history:  180 lb pre pregnancy and had pre- ecclampsia and lost daugther Started to workout after that, going 2 x a week.  Highest wt 214, weight has been stuck  Has a good support system at home.  Dad and Maternal Aunt are overweight  When asked how has your weight affected you? She states: Contributed to medical problems and Contributed to orthopedic problems or mobility issues  Some associated conditions: GERD  Contributing factors: Stress and Life event  Weight promoting medications identified: None  Current nutrition plan: None  Current level of physical activity: Walking and Strength training  Current or previous pharmacotherapy: GLP-1 and Phentermine  Response to medication: Lost weight initially but was unable to sustain weight loss   Past medical history includes:   Past Medical History:  Diagnosis Date   Anxiety    Depression      Objective:   BP 120/83   Pulse 87   Temp 98.6 F (37 C)   Ht 5\' 2"  (1.575 m)   Wt 210 lb (95.3 kg)   SpO2 95%   BMI 38.41 kg/m  She was weighed on the bioimpedance scale: Body mass index is 38.41 kg/m.  Peak MVHQIO:962 , Body Fat%:42.3, Visceral Fat Rating:10, Weight trend over the last 12 months: Decreasing  General:  Alert, oriented and cooperative. Patient is in no acute distress.  Respiratory: Normal respiratory effort, no problems with respiration noted   Gait: able to ambulate independently  Mental Status: Normal mood  and affect. Normal behavior. Normal judgment and thought content.   DIAGNOSTIC DATA REVIEWED:  BMET    Component Value Date/Time   NA 135 06/21/2019 1147   K 3.7 06/21/2019 1147   CL 105 06/21/2019 1147   CO2 19 (L) 06/21/2019 1147   GLUCOSE 137 (H) 06/21/2019 1147   BUN 10 06/21/2019 1147   CREATININE 0.73 06/21/2019 1147   CALCIUM 8.9 06/21/2019 1147   GFRNONAA >60 06/21/2019 1147   GFRAA >60 06/21/2019 1147   No results found for: "HGBA1C" No results found for: "INSULIN" CBC    Component Value Date/Time   WBC 11.7 (H) 06/21/2019 1147   RBC 4.54 06/21/2019 1147   HGB 12.6 06/21/2019 1147   HCT 36.8 06/21/2019 1147   PLT 315 06/21/2019 1147   MCV 81.1 06/21/2019 1147   MCH 27.8 06/21/2019 1147   MCHC 34.2 06/21/2019 1147   RDW 11.9 06/21/2019 1147   Iron/TIBC/Ferritin/ %Sat No results found for: "IRON", "TIBC", "FERRITIN", "IRONPCTSAT" Lipid Panel  No results found for: "CHOL", "TRIG", "HDL", "CHOLHDL", "VLDL", "LDLCALC", "LDLDIRECT" Hepatic Function Panel     Component Value Date/Time   PROT 7.7 04/24/2016 1637   ALBUMIN 4.7 04/24/2016 1637   AST 21 04/24/2016 1637   ALT 23 04/24/2016 1637   ALKPHOS 43 04/24/2016 1637   BILITOT 1.3 (H) 04/24/2016 1637   No results found for: "TSH"   Assessment and Plan:   Gastroesophageal reflux disease without esophagitis Assessment & Plan: She reports taking omeprazole 20 mg  OTC.  She is not taking this on a daily basis.  It has previously helped with acid reflux symptoms.  She denies a history of GI ulcers.  Look for improvements in GERD with weight reduction.  Avoid late night eating and high acid foods.   Obesity, Class II, BMI 35-39.9 Assessment & Plan: Answered questions that patient had concerning vertical sleeve gastrectomy.  She has attended a surgical seminar with [facility] surgery and is considering vertical sleeve gastrectomy.  Her current BMI is 38 and the only listed comorbidity is GERD.  She does  have elevated liver enzymes with no confirmed diagnosis of fatty liver disease.  She will return for a new patient visit to obtain labs.  If liver enzymes remain elevated, we will obtain a liver ultrasound to look for fatty liver disease.  Also consider obtaining a polysomnogram to look for underlying sleep apnea.  She does lack insurance coverage for incretin therapy.   BMI 38.0-38.9,adult  Abnormal weight gain Assessment & Plan: Reviewed patient's weight history.  She gained most of her weight following a pregnancy loss with preeclampsia.  She has a good support system at home.  She is motivated to attain a healthy weight and try again for a future pregnancy.  She is currently on birth control.  She is motivated to continue gym workouts 2 days a week.  Will plan to obtain a fasting IC and fasting labs next visit. Limit intake of high sugar foods and drinks and work on eating on a schedule.         Obesity Treatment / Action Plan:  Patient will work on garnering support from family and friends to begin weight loss journey. Will work on eliminating or reducing the presence of highly palatable, calorie dense foods in the home. Will complete provided nutritional and psychosocial assessment questionnaire before the next appointment. Will be scheduled for indirect calorimetry to determine resting energy expenditure in a fasting state.  This will allow [place] to create a reduced calorie, high-protein meal plan to promote loss of fat mass while preserving muscle mass. Will work on reading labels, making healthier choices and watching portion sizes. Counseled on the health benefits of losing 5%-15% of total body weight. Was counseled on nutritional approaches to weight loss and benefits of reducing processed foods and consuming plant-based foods and high quality protein as part of nutritional weight management. Was counseled on pharmacotherapy and role as an adjunct in weight management.   Obesity  Education Performed Today:  She was weighed on the bioimpedance scale and results were discussed and documented in the synopsis.  We discussed obesity as a disease and the importance of a more detailed evaluation of all the factors contributing to the disease.  We discussed the importance of long term lifestyle changes which include nutrition, exercise and behavioral modifications as well as the importance of customizing this to her specific health and social needs.  We discussed the benefits of reaching a healthier weight to alleviate the symptoms of existing conditions and reduce the risks of the biomechanical, metabolic and psychological effects of obesity.  KAMI MAULDEN appears to be in the action stage of change and states they are ready to start intensive lifestyle modifications and behavioral modifications.  30 minutes was spent today on this visit including the above counseling, pre-visit chart review, and post-visit documentation.  Reviewed by clinician on day of visit: allergies, medications, problem list, medical history, surgical history, family history, social history, and previous encounter notes  pertinent to obesity diagnosis.    Seymour Bars, D.O. DABFM, DABOM [facility] [phone] [place], [place] [address] [phone]

## 2022-06-21 ENCOUNTER — Encounter (INDEPENDENT_AMBULATORY_CARE_PROVIDER_SITE_OTHER): Payer: Self-pay | Admitting: Family Medicine

## 2022-06-21 ENCOUNTER — Ambulatory Visit (INDEPENDENT_AMBULATORY_CARE_PROVIDER_SITE_OTHER): Payer: 59 | Admitting: Family Medicine

## 2022-06-21 VITALS — BP 112/82 | HR 73 | Temp 98.0°F | Ht 62.0 in | Wt 209.0 lb

## 2022-06-21 DIAGNOSIS — R0602 Shortness of breath: Secondary | ICD-10-CM

## 2022-06-21 DIAGNOSIS — R5383 Other fatigue: Secondary | ICD-10-CM | POA: Diagnosis not present

## 2022-06-21 DIAGNOSIS — E669 Obesity, unspecified: Secondary | ICD-10-CM

## 2022-06-21 DIAGNOSIS — Z6838 Body mass index (BMI) 38.0-38.9, adult: Secondary | ICD-10-CM

## 2022-06-21 DIAGNOSIS — F419 Anxiety disorder, unspecified: Secondary | ICD-10-CM | POA: Diagnosis not present

## 2022-06-21 DIAGNOSIS — K219 Gastro-esophageal reflux disease without esophagitis: Secondary | ICD-10-CM

## 2022-06-21 DIAGNOSIS — F32A Depression, unspecified: Secondary | ICD-10-CM

## 2022-06-21 DIAGNOSIS — Z1331 Encounter for screening for depression: Secondary | ICD-10-CM

## 2022-06-22 LAB — CBC WITH DIFFERENTIAL/PLATELET
Basophils Absolute: 0.1 10*3/uL (ref 0.0–0.2)
Basos: 1 %
EOS (ABSOLUTE): 0.2 10*3/uL (ref 0.0–0.4)
Eos: 3 %
Hematocrit: 41.3 % (ref 34.0–46.6)
Hemoglobin: 13.3 g/dL (ref 11.1–15.9)
Immature Grans (Abs): 0.1 10*3/uL (ref 0.0–0.1)
Immature Granulocytes: 1 %
Lymphocytes Absolute: 2 10*3/uL (ref 0.7–3.1)
Lymphs: 25 %
MCH: 26.2 pg — ABNORMAL LOW (ref 26.6–33.0)
MCHC: 32.2 g/dL (ref 31.5–35.7)
MCV: 81 fL (ref 79–97)
Monocytes Absolute: 0.6 10*3/uL (ref 0.1–0.9)
Monocytes: 7 %
Neutrophils Absolute: 5.3 10*3/uL (ref 1.4–7.0)
Neutrophils: 63 %
Platelets: 321 10*3/uL (ref 150–450)
RBC: 5.08 x10E6/uL (ref 3.77–5.28)
RDW: 12.2 % (ref 11.7–15.4)
WBC: 8.2 10*3/uL (ref 3.4–10.8)

## 2022-06-22 LAB — COMPREHENSIVE METABOLIC PANEL
ALT: 30 IU/L (ref 0–32)
AST: 21 IU/L (ref 0–40)
Albumin: 4.7 g/dL (ref 4.0–5.0)
Alkaline Phosphatase: 77 IU/L (ref 44–121)
BUN/Creatinine Ratio: 17 (ref 9–23)
BUN: 14 mg/dL (ref 6–20)
Bilirubin Total: 0.4 mg/dL (ref 0.0–1.2)
CO2: 21 mmol/L (ref 20–29)
Calcium: 9.5 mg/dL (ref 8.7–10.2)
Chloride: 100 mmol/L (ref 96–106)
Creatinine, Ser: 0.83 mg/dL (ref 0.57–1.00)
Globulin, Total: 2.4 g/dL (ref 1.5–4.5)
Glucose: 74 mg/dL (ref 70–99)
Potassium: 4.3 mmol/L (ref 3.5–5.2)
Sodium: 137 mmol/L (ref 134–144)
Total Protein: 7.1 g/dL (ref 6.0–8.5)
eGFR: 98 mL/min/{1.73_m2} (ref >59)

## 2022-06-22 LAB — LIPID PANEL
Chol/HDL Ratio: 4.4 ratio (ref 0.0–4.4)
Cholesterol, Total: 181 mg/dL (ref 100–199)
HDL: 41 mg/dL (ref >39)
LDL Chol Calc (NIH): 103 mg/dL — ABNORMAL HIGH (ref 0–99)
Triglycerides: 213 mg/dL — ABNORMAL HIGH (ref 0–149)
VLDL Cholesterol Cal: 37 mg/dL (ref 5–40)

## 2022-06-22 LAB — T4, FREE: Free T4: 1.17 ng/dL (ref 0.82–1.77)

## 2022-06-22 LAB — HEMOGLOBIN A1C
Est. average glucose Bld gHb Est-mCnc: 103 mg/dL
Hgb A1c MFr Bld: 5.2 % (ref 4.8–5.6)

## 2022-06-22 LAB — FOLATE: Folate: 4.7 ng/mL (ref >3.0)

## 2022-06-22 LAB — TSH: TSH: 1.64 u[IU]/mL (ref 0.450–4.500)

## 2022-06-22 LAB — INSULIN, RANDOM: INSULIN: 22 u[IU]/mL (ref 2.6–24.9)

## 2022-06-22 LAB — FERRITIN: Ferritin: 50 ng/mL (ref 15–150)

## 2022-06-22 LAB — VITAMIN B12: Vitamin B-12: 273 pg/mL (ref 232–1245)

## 2022-06-22 LAB — VITAMIN D 25 HYDROXY (VIT D DEFICIENCY, FRACTURES): Vit D, 25-Hydroxy: 31.5 ng/mL (ref 30.0–100.0)

## 2022-06-26 NOTE — Progress Notes (Unsigned)
Chief Complaint:   OBESITY Margaret Walton (MR# [id number]) is a 32 y.o. female who presents for evaluation and treatment of obesity and related comorbidities. Current BMI is Body mass index is 38.23 kg/m. Margaret Walton has been struggling with her weight for many years and has been unsuccessful in either losing weight, maintaining weight loss, or reaching her healthy weight goal.  Patient works a sedentary job as a Animator.  Patient averages about 4000 steps daily.  She lives with her boyfriend, Margaret Walton who is supportive.  She would like to try for future pregnancy. (Fetal demise) she drinks sweet tea, is a stress eater with binge eating.  Margaret Walton is currently in the action stage of change and ready to dedicate time achieving and maintaining a healthier weight. Margaret Walton is interested in becoming our patient and working on intensive lifestyle modifications including (but not limited to) diet and exercise for weight loss.  Margaret Walton's habits were reviewed today and are as follows: Her family eats meals together, she thinks her family will eat healthier with her, her desired weight loss is 59 lb, she has been heavy most of her life, she started gaining weight during pregnancy, her heaviest weight ever was 214 pounds, she has significant food cravings issues, she snacks frequently in the evenings, she wakes up frequently in the middle of the night to eat, she is frequently drinking liquids with calories, she frequently eats larger portions than normal, and she struggles with emotional eating.  Depression Screen Margaret Walton's Food and Mood (modified PHQ-9) score was 5.  Subjective:   1. Other fatigue Margaret Walton admits to daytime somnolence and admits to waking up still tired. Patient has a history of symptoms of daytime fatigue and morning fatigue. Margaret Walton generally gets 6 or 7 hours of sleep per night, and states that she has nightime awakenings. Snoring is present. Apneic  episodes are present. Epworth Sleepiness Score is 3.  EKG, NSR without ischemic changes.  2. SOBOE (shortness of breath on exertion) Margaret Walton notes increasing shortness of breath with exercising and seems to be worsening over time with weight gain. She notes getting out of breath sooner with activity than she used to. This has not gotten worse recently. Margaret Walton denies shortness of breath at rest or orthopnea.  3. Gastroesophageal reflux disease, unspecified whether esophagitis present Patient has omeprazole 20 mg and uses as needed.  GERD is triggered by broccoli, ketchup.  Patient avoids late-night eating and fried foods.  4. Anxiety and depression Patient is on Lexapro 20 mg daily.  Bariatric PHQ-9:5  Assessment/Plan:   1. Other fatigue Margaret Walton does feel that her weight is causing her energy to be lower than it should be. Fatigue may be related to obesity, depression or many other causes. Labs will be ordered, and in the meanwhile, Margaret Walton will focus on self care including making healthy food choices, increasing physical activity and focusing on stress reduction.  Update labs today.  - EKG 12-Lead - VITAMIN D 25 Hydroxy (Vit-D Deficiency, Fractures) - TSH - T4, free - Lipid panel - Insulin, random - Hemoglobin A1c - Folate - Comprehensive metabolic panel - Vitamin B12 - CBC with Differential/Platelet - Ferritin  2. SOBOE (shortness of breath on exertion) Margaret Walton does feel that she gets out of breath more easily that she used to when she exercises. Margaret Walton's shortness of breath appears to be obesity related and exercise induced. She has agreed to work on weight loss and gradually increase exercise to treat her exercise induced  shortness of breath. Will continue to monitor closely.  3. Gastroesophageal reflux disease, unspecified whether esophagitis present Continue behavioral changes.  Look for improvements with weight loss.  4. Anxiety and depression Continue Lexapro 20 mg  daily.  Work on stress reduction and improving sleep at night.  5. Depression screen Margaret Walton had a positive depression screening. Depression is commonly associated with obesity and often results in emotional eating behaviors. We will monitor this closely and work on CBT to help improve the non-hunger eating patterns. Referral to Psychology may be required if no improvement is seen as she continues in our clinic.  6. Obesity, Class II, BMI 35-39.9  7. Obesity, starting BMI 38.4 Margaret Walton is currently in the action stage of change and her goal is to continue with weight loss efforts. I recommend Margaret Walton begin the structured treatment plan as follows:  She has agreed to the Category 3 Plan.  100-calorie snack list given to patient today.  Exercise goals: All adults should avoid inactivity. Some physical activity is better than none, and adults who participate in any amount of physical activity gain some health benefits.   Behavioral modification strategies: increasing lean protein intake, increasing vegetables, increasing water intake, no skipping meals, meal planning and cooking strategies, keeping healthy foods in the home, better snacking choices, and planning for success.  She was informed of the importance of frequent follow-up visits to maximize her success with intensive lifestyle modifications for her multiple health conditions. She was informed we would discuss her lab results at her next visit unless there is a critical issue that needs to be addressed sooner. Margaret Walton agreed to keep her next visit at the agreed upon time to discuss these results.  Objective:   Blood pressure 112/82, pulse 73, temperature 98 F (36.7 C), height 5\' 2"  (1.575 m), weight 209 lb (94.8 kg), SpO2 96 %. Body mass index is 38.23 kg/m.  EKG: Normal sinus rhythm, rate 84 bpm.  Indirect Calorimeter completed today shows a VO2 of 270 and a REE of 1858.  Her calculated basal metabolic rate is 4098 thus her basal  metabolic rate is better than expected.  General: Cooperative, alert, well developed, in no acute distress. HEENT: Conjunctivae and lids unremarkable. Cardiovascular: Regular rhythm.  Lungs: Normal work of breathing. Neurologic: No focal deficits.   Lab Results  Component Value Date   CREATININE 0.83 06/21/2022   BUN 14 06/21/2022   NA 137 06/21/2022   K 4.3 06/21/2022   CL 100 06/21/2022   CO2 21 06/21/2022   Lab Results  Component Value Date   ALT 30 06/21/2022   AST 21 06/21/2022   ALKPHOS 77 06/21/2022   BILITOT 0.4 06/21/2022   Lab Results  Component Value Date   HGBA1C 5.2 06/21/2022   Lab Results  Component Value Date   INSULIN 22.0 06/21/2022   Lab Results  Component Value Date   TSH 1.640 06/21/2022   Lab Results  Component Value Date   CHOL 181 06/21/2022   HDL 41 06/21/2022   LDLCALC 103 (H) 06/21/2022   TRIG 213 (H) 06/21/2022   CHOLHDL 4.4 06/21/2022   Lab Results  Component Value Date   WBC 8.2 06/21/2022   HGB 13.3 06/21/2022   HCT 41.3 06/21/2022   MCV 81 06/21/2022   PLT 321 06/21/2022   Lab Results  Component Value Date   FERRITIN 50 06/21/2022   Attestation Statements:   Reviewed by clinician on day of visit: allergies, medications, problem list, medical history, surgical  history, family history, social history, and previous encounter notes.  Time spent on visit including pre-visit chart review and post-visit charting and care was 40 minutes.   I, Malcolm Metro, am acting as Energy manager for Seymour Bars, DO.  I have reviewed the above documentation for accuracy and completeness, and I agree with the above. Seymour Bars DO

## 2022-07-04 ENCOUNTER — Ambulatory Visit (INDEPENDENT_AMBULATORY_CARE_PROVIDER_SITE_OTHER): Payer: 59 | Admitting: Family Medicine

## 2022-07-18 ENCOUNTER — Ambulatory Visit (INDEPENDENT_AMBULATORY_CARE_PROVIDER_SITE_OTHER): Payer: 59 | Admitting: Family Medicine

## 2022-07-26 ENCOUNTER — Ambulatory Visit (INDEPENDENT_AMBULATORY_CARE_PROVIDER_SITE_OTHER): Payer: 59 | Admitting: Urology

## 2022-07-26 VITALS — BP 113/73 | HR 102

## 2022-07-26 DIAGNOSIS — R109 Unspecified abdominal pain: Secondary | ICD-10-CM | POA: Diagnosis not present

## 2022-07-26 DIAGNOSIS — Z87442 Personal history of urinary calculi: Secondary | ICD-10-CM | POA: Diagnosis not present

## 2022-07-26 DIAGNOSIS — R1012 Left upper quadrant pain: Secondary | ICD-10-CM | POA: Diagnosis not present

## 2022-07-26 NOTE — Progress Notes (Signed)
07/26/2022 4:57 PM   Margaret Walton 1990-08-21 [id number]  Referring provider: Richardean Chimera, MD [address] [place],  [place] [address]  nephrolithiasis   HPI: Margaret Walton is a 31yo here for evaluation of nephrolithiasis. Starting 2 days ago she developed left lower quadrant abdominal pain. She has a history of a stone event several years ago. No worsening LUTS. UA today is normal. No other complaints today   PMH: Past Medical History:  Diagnosis Date   Anxiety    Asthma    Back pain    Constipation    Depression    Heart burn    High blood pressure    Joint pain    Swelling of both lower extremities     Surgical History: No past surgical history on file.  Home Medications:  Allergies as of 07/26/2022       Reactions   Augmentin [amoxicillin-pot Clavulanate] Nausea And Vomiting   Morphine And Codeine Other (See Comments)   Irritable         Medication List        Accurate as of July 26, 2022  4:57 PM. If you have any questions, ask your nurse or doctor.          escitalopram 10 MG tablet Commonly known as: LEXAPRO Take 10 mg by mouth at bedtime.   norethindrone 0.35 MG tablet Commonly known as: MICRONOR Take by mouth.        Allergies:  Allergies  Allergen Reactions   Augmentin [Amoxicillin-Pot Clavulanate] Nausea And Vomiting   Morphine And Codeine Other (See Comments)    Irritable     Family History: Family History  Problem Relation Age of Onset   Depression Father    Anxiety disorder Father    Obesity Father     Social History:  reports that she has quit smoking. She has never used smokeless tobacco. She reports that she does not drink alcohol and does not use drugs.  ROS: All other review of systems were reviewed and are negative except what is noted above in HPI  Physical Exam: BP 113/73   Pulse (!) 102   Constitutional:  Alert and oriented, No acute distress. HEENT: [place] AT, moist mucus membranes.  Trachea midline,  no masses. Cardiovascular: No clubbing, cyanosis, or edema. Respiratory: Normal respiratory effort, no increased work of breathing. GI: Abdomen is soft, nontender, nondistended, no abdominal masses GU: No CVA tenderness.  Lymph: No cervical or inguinal lymphadenopathy. Skin: No rashes, bruises or suspicious lesions. Neurologic: Grossly intact, no focal deficits, moving all 4 extremities. Psychiatric: Normal mood and affect.  Laboratory Data: Lab Results  Component Value Date   WBC 8.2 06/21/2022   HGB 13.3 06/21/2022   HCT 41.3 06/21/2022   MCV 81 06/21/2022   PLT 321 06/21/2022    Lab Results  Component Value Date   CREATININE 0.83 06/21/2022    No results found for: "PSA"  No results found for: "TESTOSTERONE"  Lab Results  Component Value Date   HGBA1C 5.2 06/21/2022    Urinalysis    Component Value Date/Time   COLORURINE YELLOW 06/21/2019 1142   APPEARANCEUR HAZY (A) 06/21/2019 1142   LABSPEC 1.023 06/21/2019 1142   PHURINE 5.0 06/21/2019 1142   GLUCOSEU NEGATIVE 06/21/2019 1142   HGBUR NEGATIVE 06/21/2019 1142   BILIRUBINUR NEGATIVE 06/21/2019 1142   KETONESUR NEGATIVE 06/21/2019 1142   PROTEINUR NEGATIVE 06/21/2019 1142   NITRITE NEGATIVE 06/21/2019 1142   LEUKOCYTESUR NEGATIVE 06/21/2019 1142  No results found for: "LABMICR", "WBCUA", "RBCUA", "LABEPIT", "MUCUS", "BACTERIA"  Pertinent Imaging:  No results found for this or any previous visit.  No results found for this or any previous visit.  No results found for this or any previous visit.  No results found for this or any previous visit.  No results found for this or any previous visit.  No valid procedures specified. No results found for this or any previous visit.  No results found for this or any previous visit.   Assessment & Plan:    1. Left flank pain  - CT RENAL STONE STUDY   No follow-ups on file.  Wilkie Aye, MD  [facility] [place]

## 2022-07-27 ENCOUNTER — Ambulatory Visit (HOSPITAL_COMMUNITY)
Admission: RE | Admit: 2022-07-27 | Discharge: 2022-07-27 | Disposition: A | Discharge status: Home / Self Care | Payer: 59 | Source: Ambulatory Visit | Attending: Urology | Admitting: Urology

## 2022-07-27 DIAGNOSIS — R1012 Left upper quadrant pain: Secondary | ICD-10-CM | POA: Insufficient documentation

## 2022-07-27 DIAGNOSIS — R109 Unspecified abdominal pain: Secondary | ICD-10-CM | POA: Diagnosis not present

## 2022-07-27 LAB — URINALYSIS, ROUTINE W REFLEX MICROSCOPIC
Bilirubin, UA: NEGATIVE
Glucose, UA: NEGATIVE
Ketones, UA: NEGATIVE
Nitrite, UA: NEGATIVE
Protein,UA: NEGATIVE
RBC, UA: NEGATIVE
Specific Gravity, UA: 1.02 (ref 1.005–1.030)
Urobilinogen, Ur: 1 mg/dL (ref 0.2–1.0)

## 2022-08-01 ENCOUNTER — Encounter: Payer: Self-pay | Admitting: Urology

## 2022-08-01 ENCOUNTER — Ambulatory Visit (INDEPENDENT_AMBULATORY_CARE_PROVIDER_SITE_OTHER): Payer: 59 | Admitting: Family Medicine

## 2022-08-01 NOTE — Patient Instructions (Signed)

## 2022-09-25 DIAGNOSIS — F419 Anxiety disorder, unspecified: Secondary | ICD-10-CM | POA: Diagnosis not present

## 2022-09-25 DIAGNOSIS — N926 Irregular menstruation, unspecified: Secondary | ICD-10-CM | POA: Diagnosis not present

## 2022-09-25 DIAGNOSIS — Z6837 Body mass index (BMI) 37.0-37.9, adult: Secondary | ICD-10-CM | POA: Diagnosis not present

## 2022-09-25 DIAGNOSIS — Z0000 Encounter for general adult medical examination without abnormal findings: Secondary | ICD-10-CM | POA: Diagnosis not present

## 2022-09-25 DIAGNOSIS — E6609 Other obesity due to excess calories: Secondary | ICD-10-CM | POA: Diagnosis not present

## 2022-09-25 DIAGNOSIS — R030 Elevated blood-pressure reading, without diagnosis of hypertension: Secondary | ICD-10-CM | POA: Diagnosis not present

## 2022-12-21 DIAGNOSIS — Z124 Encounter for screening for malignant neoplasm of cervix: Secondary | ICD-10-CM | POA: Diagnosis not present

## 2022-12-21 DIAGNOSIS — Z01419 Encounter for gynecological examination (general) (routine) without abnormal findings: Secondary | ICD-10-CM | POA: Diagnosis not present

## 2023-03-07 DIAGNOSIS — D2239 Melanocytic nevi of other parts of face: Secondary | ICD-10-CM | POA: Diagnosis not present

## 2023-03-07 DIAGNOSIS — L814 Other melanin hyperpigmentation: Secondary | ICD-10-CM | POA: Diagnosis not present

## 2023-03-29 DIAGNOSIS — S8011XA Contusion of right lower leg, initial encounter: Secondary | ICD-10-CM | POA: Diagnosis not present

## 2023-03-29 DIAGNOSIS — Z880 Allergy status to penicillin: Secondary | ICD-10-CM | POA: Diagnosis not present

## 2023-03-29 DIAGNOSIS — Z885 Allergy status to narcotic agent status: Secondary | ICD-10-CM | POA: Diagnosis not present

## 2023-03-29 DIAGNOSIS — M79671 Pain in right foot: Secondary | ICD-10-CM | POA: Diagnosis not present

## 2023-03-29 DIAGNOSIS — S8991XA Unspecified injury of right lower leg, initial encounter: Secondary | ICD-10-CM | POA: Diagnosis not present

## 2023-03-29 DIAGNOSIS — M25571 Pain in right ankle and joints of right foot: Secondary | ICD-10-CM | POA: Diagnosis not present

## 2023-04-30 DIAGNOSIS — N926 Irregular menstruation, unspecified: Secondary | ICD-10-CM | POA: Diagnosis not present

## 2023-06-01 DIAGNOSIS — O09291 Supervision of pregnancy with other poor reproductive or obstetric history, first trimester: Secondary | ICD-10-CM | POA: Diagnosis not present

## 2023-06-01 DIAGNOSIS — O09211 Supervision of pregnancy with history of pre-term labor, first trimester: Secondary | ICD-10-CM | POA: Diagnosis not present

## 2023-06-01 DIAGNOSIS — O09299 Supervision of pregnancy with other poor reproductive or obstetric history, unspecified trimester: Secondary | ICD-10-CM | POA: Diagnosis not present

## 2023-06-01 DIAGNOSIS — Z3A09 9 weeks gestation of pregnancy: Secondary | ICD-10-CM | POA: Diagnosis not present

## 2023-06-01 DIAGNOSIS — Z3687 Encounter for antenatal screening for uncertain dates: Secondary | ICD-10-CM | POA: Diagnosis not present

## 2023-06-08 DIAGNOSIS — Z3A09 9 weeks gestation of pregnancy: Secondary | ICD-10-CM | POA: Diagnosis not present

## 2023-06-08 DIAGNOSIS — Z3689 Encounter for other specified antenatal screening: Secondary | ICD-10-CM | POA: Diagnosis not present

## 2023-06-11 DIAGNOSIS — O99210 Obesity complicating pregnancy, unspecified trimester: Secondary | ICD-10-CM | POA: Diagnosis not present

## 2023-06-11 DIAGNOSIS — Z880 Allergy status to penicillin: Secondary | ICD-10-CM | POA: Diagnosis not present

## 2023-06-11 DIAGNOSIS — O364XX0 Maternal care for intrauterine death, not applicable or unspecified: Secondary | ICD-10-CM | POA: Diagnosis not present

## 2023-06-11 DIAGNOSIS — Z8759 Personal history of other complications of pregnancy, childbirth and the puerperium: Secondary | ICD-10-CM | POA: Diagnosis not present

## 2023-06-11 DIAGNOSIS — Z3A10 10 weeks gestation of pregnancy: Secondary | ICD-10-CM | POA: Diagnosis not present

## 2023-06-11 DIAGNOSIS — F419 Anxiety disorder, unspecified: Secondary | ICD-10-CM | POA: Diagnosis not present

## 2023-06-11 DIAGNOSIS — F32A Depression, unspecified: Secondary | ICD-10-CM | POA: Diagnosis not present

## 2023-07-02 DIAGNOSIS — Z8759 Personal history of other complications of pregnancy, childbirth and the puerperium: Secondary | ICD-10-CM | POA: Diagnosis not present

## 2023-07-02 DIAGNOSIS — F32A Depression, unspecified: Secondary | ICD-10-CM | POA: Diagnosis not present

## 2023-07-02 DIAGNOSIS — O99210 Obesity complicating pregnancy, unspecified trimester: Secondary | ICD-10-CM | POA: Diagnosis not present

## 2023-07-02 DIAGNOSIS — O99211 Obesity complicating pregnancy, first trimester: Secondary | ICD-10-CM | POA: Diagnosis not present

## 2023-07-02 DIAGNOSIS — O09299 Supervision of pregnancy with other poor reproductive or obstetric history, unspecified trimester: Secondary | ICD-10-CM | POA: Diagnosis not present

## 2023-07-02 DIAGNOSIS — F419 Anxiety disorder, unspecified: Secondary | ICD-10-CM | POA: Diagnosis not present

## 2023-07-02 DIAGNOSIS — Z3A13 13 weeks gestation of pregnancy: Secondary | ICD-10-CM | POA: Diagnosis not present

## 2023-07-02 DIAGNOSIS — O09291 Supervision of pregnancy with other poor reproductive or obstetric history, first trimester: Secondary | ICD-10-CM | POA: Diagnosis not present

## 2023-07-02 DIAGNOSIS — O364XX0 Maternal care for intrauterine death, not applicable or unspecified: Secondary | ICD-10-CM | POA: Diagnosis not present

## 2023-07-09 DIAGNOSIS — Z8759 Personal history of other complications of pregnancy, childbirth and the puerperium: Secondary | ICD-10-CM | POA: Diagnosis not present

## 2023-07-09 DIAGNOSIS — Z3A14 14 weeks gestation of pregnancy: Secondary | ICD-10-CM | POA: Diagnosis not present

## 2023-07-09 DIAGNOSIS — F32A Depression, unspecified: Secondary | ICD-10-CM | POA: Diagnosis not present

## 2023-07-09 DIAGNOSIS — F419 Anxiety disorder, unspecified: Secondary | ICD-10-CM | POA: Diagnosis not present

## 2023-08-13 DIAGNOSIS — Z36 Encounter for antenatal screening for chromosomal anomalies: Secondary | ICD-10-CM | POA: Diagnosis not present

## 2023-08-13 DIAGNOSIS — O99212 Obesity complicating pregnancy, second trimester: Secondary | ICD-10-CM | POA: Diagnosis not present

## 2023-08-13 DIAGNOSIS — O4422 Partial placenta previa NOS or without hemorrhage, second trimester: Secondary | ICD-10-CM | POA: Diagnosis not present

## 2023-08-13 DIAGNOSIS — O99210 Obesity complicating pregnancy, unspecified trimester: Secondary | ICD-10-CM | POA: Diagnosis not present

## 2023-08-13 DIAGNOSIS — O09292 Supervision of pregnancy with other poor reproductive or obstetric history, second trimester: Secondary | ICD-10-CM | POA: Diagnosis not present

## 2023-08-13 DIAGNOSIS — Z8759 Personal history of other complications of pregnancy, childbirth and the puerperium: Secondary | ICD-10-CM | POA: Diagnosis not present

## 2023-08-13 DIAGNOSIS — Z3A19 19 weeks gestation of pregnancy: Secondary | ICD-10-CM | POA: Diagnosis not present

## 2023-09-14 DIAGNOSIS — O99212 Obesity complicating pregnancy, second trimester: Secondary | ICD-10-CM | POA: Diagnosis not present

## 2023-09-14 DIAGNOSIS — Z8759 Personal history of other complications of pregnancy, childbirth and the puerperium: Secondary | ICD-10-CM | POA: Diagnosis not present

## 2023-09-14 DIAGNOSIS — O09292 Supervision of pregnancy with other poor reproductive or obstetric history, second trimester: Secondary | ICD-10-CM | POA: Diagnosis not present

## 2023-09-14 DIAGNOSIS — Z3689 Encounter for other specified antenatal screening: Secondary | ICD-10-CM | POA: Diagnosis not present

## 2023-09-14 DIAGNOSIS — O4442 Low lying placenta NOS or without hemorrhage, second trimester: Secondary | ICD-10-CM | POA: Diagnosis not present

## 2023-09-14 DIAGNOSIS — Z3A24 24 weeks gestation of pregnancy: Secondary | ICD-10-CM | POA: Diagnosis not present

## 2023-09-14 DIAGNOSIS — Z362 Encounter for other antenatal screening follow-up: Secondary | ICD-10-CM | POA: Diagnosis not present

## 2023-09-24 ENCOUNTER — Other Ambulatory Visit (HOSPITAL_BASED_OUTPATIENT_CLINIC_OR_DEPARTMENT_OTHER): Payer: Self-pay

## 2023-09-24 MED ORDER — ESCITALOPRAM OXALATE 20 MG PO TABS
20.0000 mg | ORAL_TABLET | Freq: Every day | ORAL | 1 refills | Status: AC
  Filled 2023-09-24 – 2023-10-10 (×2): qty 90, 90d supply, fill #0
  Filled 2024-01-24: qty 90, 90d supply, fill #1

## 2023-09-28 DIAGNOSIS — Z880 Allergy status to penicillin: Secondary | ICD-10-CM | POA: Diagnosis not present

## 2023-09-28 DIAGNOSIS — Z889 Allergy status to unspecified drugs, medicaments and biological substances status: Secondary | ICD-10-CM | POA: Diagnosis not present

## 2023-10-04 ENCOUNTER — Other Ambulatory Visit (HOSPITAL_BASED_OUTPATIENT_CLINIC_OR_DEPARTMENT_OTHER): Payer: Self-pay

## 2023-10-08 ENCOUNTER — Other Ambulatory Visit (HOSPITAL_BASED_OUTPATIENT_CLINIC_OR_DEPARTMENT_OTHER): Payer: Self-pay

## 2023-10-10 ENCOUNTER — Other Ambulatory Visit (HOSPITAL_BASED_OUTPATIENT_CLINIC_OR_DEPARTMENT_OTHER): Payer: Self-pay

## 2023-10-10 ENCOUNTER — Other Ambulatory Visit: Payer: Self-pay

## 2023-10-12 DIAGNOSIS — Z3A28 28 weeks gestation of pregnancy: Secondary | ICD-10-CM | POA: Diagnosis not present

## 2023-10-12 DIAGNOSIS — E66812 Obesity, class 2: Secondary | ICD-10-CM | POA: Diagnosis not present

## 2023-10-12 DIAGNOSIS — O4440 Low lying placenta NOS or without hemorrhage, unspecified trimester: Secondary | ICD-10-CM | POA: Diagnosis not present

## 2023-10-12 DIAGNOSIS — O99210 Obesity complicating pregnancy, unspecified trimester: Secondary | ICD-10-CM | POA: Diagnosis not present

## 2023-10-12 DIAGNOSIS — O99213 Obesity complicating pregnancy, third trimester: Secondary | ICD-10-CM | POA: Diagnosis not present

## 2023-10-12 DIAGNOSIS — Z3689 Encounter for other specified antenatal screening: Secondary | ICD-10-CM | POA: Diagnosis not present

## 2023-10-12 DIAGNOSIS — O4443 Low lying placenta NOS or without hemorrhage, third trimester: Secondary | ICD-10-CM | POA: Diagnosis not present

## 2023-10-12 DIAGNOSIS — Z8759 Personal history of other complications of pregnancy, childbirth and the puerperium: Secondary | ICD-10-CM | POA: Diagnosis not present

## 2023-10-12 DIAGNOSIS — O364XX0 Maternal care for intrauterine death, not applicable or unspecified: Secondary | ICD-10-CM | POA: Diagnosis not present

## 2023-10-12 DIAGNOSIS — O09293 Supervision of pregnancy with other poor reproductive or obstetric history, third trimester: Secondary | ICD-10-CM | POA: Diagnosis not present

## 2023-10-15 ENCOUNTER — Other Ambulatory Visit (HOSPITAL_BASED_OUTPATIENT_CLINIC_OR_DEPARTMENT_OTHER): Payer: Self-pay

## 2023-10-15 MED ORDER — BLOOD GLUCOSE MONITOR SYSTEM W/DEVICE KIT
PACK | 0 refills | Status: AC
Start: 2023-10-14 — End: ?
  Filled 2023-10-15: qty 1, 30d supply, fill #0

## 2023-10-15 MED ORDER — PRECISION QID TEST VI STRP
ORAL_STRIP | Freq: Four times a day (QID) | 2 refills | Status: AC
  Filled 2023-10-15: qty 100, 25d supply, fill #0

## 2023-10-15 MED ORDER — FREESTYLE LANCETS MISC
22 refills | Status: AC
  Filled 2023-10-15: qty 100, 25d supply, fill #0

## 2023-10-19 ENCOUNTER — Other Ambulatory Visit (HOSPITAL_BASED_OUTPATIENT_CLINIC_OR_DEPARTMENT_OTHER): Payer: Self-pay

## 2023-10-19 MED ORDER — FREESTYLE LIBRE 3 PLUS SENSOR MISC
3 refills | Status: DC
  Filled 2023-10-19 – 2023-11-06 (×2): qty 2, 30d supply, fill #0

## 2023-10-19 MED ORDER — FREESTYLE LIBRE 3 READER DEVI
0 refills | Status: DC
  Filled 2023-10-19: qty 1, 30d supply, fill #0

## 2023-10-24 DIAGNOSIS — Z3A30 30 weeks gestation of pregnancy: Secondary | ICD-10-CM | POA: Diagnosis not present

## 2023-10-24 DIAGNOSIS — O24410 Gestational diabetes mellitus in pregnancy, diet controlled: Secondary | ICD-10-CM | POA: Diagnosis not present

## 2023-10-30 ENCOUNTER — Other Ambulatory Visit (HOSPITAL_BASED_OUTPATIENT_CLINIC_OR_DEPARTMENT_OTHER): Payer: Self-pay

## 2023-10-31 ENCOUNTER — Other Ambulatory Visit (HOSPITAL_BASED_OUTPATIENT_CLINIC_OR_DEPARTMENT_OTHER): Payer: Self-pay

## 2023-11-05 ENCOUNTER — Other Ambulatory Visit (HOSPITAL_BASED_OUTPATIENT_CLINIC_OR_DEPARTMENT_OTHER): Payer: Self-pay

## 2023-11-06 ENCOUNTER — Other Ambulatory Visit (HOSPITAL_BASED_OUTPATIENT_CLINIC_OR_DEPARTMENT_OTHER): Payer: Self-pay

## 2023-11-09 DIAGNOSIS — O09293 Supervision of pregnancy with other poor reproductive or obstetric history, third trimester: Secondary | ICD-10-CM | POA: Diagnosis not present

## 2023-11-09 DIAGNOSIS — O09292 Supervision of pregnancy with other poor reproductive or obstetric history, second trimester: Secondary | ICD-10-CM | POA: Diagnosis not present

## 2023-11-09 DIAGNOSIS — Z369 Encounter for antenatal screening, unspecified: Secondary | ICD-10-CM | POA: Diagnosis not present

## 2023-11-09 DIAGNOSIS — O09299 Supervision of pregnancy with other poor reproductive or obstetric history, unspecified trimester: Secondary | ICD-10-CM | POA: Diagnosis not present

## 2023-11-09 DIAGNOSIS — Z8759 Personal history of other complications of pregnancy, childbirth and the puerperium: Secondary | ICD-10-CM | POA: Diagnosis not present

## 2023-11-09 DIAGNOSIS — E669 Obesity, unspecified: Secondary | ICD-10-CM | POA: Diagnosis not present

## 2023-11-09 DIAGNOSIS — Z3A32 32 weeks gestation of pregnancy: Secondary | ICD-10-CM | POA: Diagnosis not present

## 2023-11-09 DIAGNOSIS — O99210 Obesity complicating pregnancy, unspecified trimester: Secondary | ICD-10-CM | POA: Diagnosis not present

## 2023-11-09 DIAGNOSIS — O99213 Obesity complicating pregnancy, third trimester: Secondary | ICD-10-CM | POA: Diagnosis not present

## 2023-11-09 DIAGNOSIS — Z23 Encounter for immunization: Secondary | ICD-10-CM | POA: Diagnosis not present

## 2023-11-16 DIAGNOSIS — O09293 Supervision of pregnancy with other poor reproductive or obstetric history, third trimester: Secondary | ICD-10-CM | POA: Diagnosis not present

## 2023-11-16 DIAGNOSIS — Z369 Encounter for antenatal screening, unspecified: Secondary | ICD-10-CM | POA: Diagnosis not present

## 2023-11-16 DIAGNOSIS — O09299 Supervision of pregnancy with other poor reproductive or obstetric history, unspecified trimester: Secondary | ICD-10-CM | POA: Diagnosis not present

## 2023-11-16 DIAGNOSIS — Z3A33 33 weeks gestation of pregnancy: Secondary | ICD-10-CM | POA: Diagnosis not present

## 2023-11-16 DIAGNOSIS — Z8759 Personal history of other complications of pregnancy, childbirth and the puerperium: Secondary | ICD-10-CM | POA: Diagnosis not present

## 2023-11-16 DIAGNOSIS — E66812 Obesity, class 2: Secondary | ICD-10-CM | POA: Diagnosis not present

## 2023-11-16 DIAGNOSIS — O99213 Obesity complicating pregnancy, third trimester: Secondary | ICD-10-CM | POA: Diagnosis not present

## 2023-11-20 DIAGNOSIS — R110 Nausea: Secondary | ICD-10-CM | POA: Diagnosis not present

## 2023-11-20 DIAGNOSIS — Z3689 Encounter for other specified antenatal screening: Secondary | ICD-10-CM | POA: Diagnosis not present

## 2023-11-20 DIAGNOSIS — O99891 Other specified diseases and conditions complicating pregnancy: Secondary | ICD-10-CM | POA: Diagnosis not present

## 2023-11-20 DIAGNOSIS — O26893 Other specified pregnancy related conditions, third trimester: Secondary | ICD-10-CM | POA: Diagnosis not present

## 2023-11-20 DIAGNOSIS — R030 Elevated blood-pressure reading, without diagnosis of hypertension: Secondary | ICD-10-CM | POA: Diagnosis not present

## 2023-11-20 DIAGNOSIS — Z3A34 34 weeks gestation of pregnancy: Secondary | ICD-10-CM | POA: Diagnosis not present

## 2023-11-20 DIAGNOSIS — R519 Headache, unspecified: Secondary | ICD-10-CM | POA: Diagnosis not present

## 2023-11-23 DIAGNOSIS — O99210 Obesity complicating pregnancy, unspecified trimester: Secondary | ICD-10-CM | POA: Diagnosis not present

## 2023-11-23 DIAGNOSIS — Z3A34 34 weeks gestation of pregnancy: Secondary | ICD-10-CM | POA: Diagnosis not present

## 2023-11-23 DIAGNOSIS — Z8759 Personal history of other complications of pregnancy, childbirth and the puerperium: Secondary | ICD-10-CM | POA: Diagnosis not present

## 2023-11-23 DIAGNOSIS — O09299 Supervision of pregnancy with other poor reproductive or obstetric history, unspecified trimester: Secondary | ICD-10-CM | POA: Diagnosis not present

## 2023-11-23 DIAGNOSIS — O09293 Supervision of pregnancy with other poor reproductive or obstetric history, third trimester: Secondary | ICD-10-CM | POA: Diagnosis not present

## 2023-11-23 DIAGNOSIS — O99213 Obesity complicating pregnancy, third trimester: Secondary | ICD-10-CM | POA: Diagnosis not present

## 2023-11-23 DIAGNOSIS — Z3689 Encounter for other specified antenatal screening: Secondary | ICD-10-CM | POA: Diagnosis not present

## 2023-11-23 DIAGNOSIS — Z369 Encounter for antenatal screening, unspecified: Secondary | ICD-10-CM | POA: Diagnosis not present

## 2023-11-28 DIAGNOSIS — O163 Unspecified maternal hypertension, third trimester: Secondary | ICD-10-CM | POA: Diagnosis not present

## 2023-11-28 DIAGNOSIS — O09293 Supervision of pregnancy with other poor reproductive or obstetric history, third trimester: Secondary | ICD-10-CM | POA: Diagnosis not present

## 2023-11-28 DIAGNOSIS — O99213 Obesity complicating pregnancy, third trimester: Secondary | ICD-10-CM | POA: Diagnosis not present

## 2023-11-28 DIAGNOSIS — O24410 Gestational diabetes mellitus in pregnancy, diet controlled: Secondary | ICD-10-CM | POA: Diagnosis not present

## 2023-11-28 DIAGNOSIS — Z3A35 35 weeks gestation of pregnancy: Secondary | ICD-10-CM | POA: Diagnosis not present

## 2023-11-28 DIAGNOSIS — E669 Obesity, unspecified: Secondary | ICD-10-CM | POA: Diagnosis not present

## 2023-11-28 DIAGNOSIS — Z369 Encounter for antenatal screening, unspecified: Secondary | ICD-10-CM | POA: Diagnosis not present

## 2023-12-02 DIAGNOSIS — O26893 Other specified pregnancy related conditions, third trimester: Secondary | ICD-10-CM | POA: Diagnosis not present

## 2023-12-02 DIAGNOSIS — O99213 Obesity complicating pregnancy, third trimester: Secondary | ICD-10-CM | POA: Diagnosis not present

## 2023-12-02 DIAGNOSIS — O133 Gestational [pregnancy-induced] hypertension without significant proteinuria, third trimester: Secondary | ICD-10-CM | POA: Diagnosis not present

## 2023-12-02 DIAGNOSIS — F32A Depression, unspecified: Secondary | ICD-10-CM | POA: Diagnosis not present

## 2023-12-02 DIAGNOSIS — O163 Unspecified maternal hypertension, third trimester: Secondary | ICD-10-CM | POA: Diagnosis not present

## 2023-12-02 DIAGNOSIS — F419 Anxiety disorder, unspecified: Secondary | ICD-10-CM | POA: Diagnosis not present

## 2023-12-02 DIAGNOSIS — Z3A36 36 weeks gestation of pregnancy: Secondary | ICD-10-CM | POA: Diagnosis not present

## 2023-12-02 DIAGNOSIS — Z3A35 35 weeks gestation of pregnancy: Secondary | ICD-10-CM | POA: Diagnosis not present

## 2023-12-07 DIAGNOSIS — O09293 Supervision of pregnancy with other poor reproductive or obstetric history, third trimester: Secondary | ICD-10-CM | POA: Diagnosis not present

## 2023-12-07 DIAGNOSIS — Z369 Encounter for antenatal screening, unspecified: Secondary | ICD-10-CM | POA: Diagnosis not present

## 2023-12-07 DIAGNOSIS — E669 Obesity, unspecified: Secondary | ICD-10-CM | POA: Diagnosis not present

## 2023-12-07 DIAGNOSIS — Z8759 Personal history of other complications of pregnancy, childbirth and the puerperium: Secondary | ICD-10-CM | POA: Diagnosis not present

## 2023-12-07 DIAGNOSIS — O09299 Supervision of pregnancy with other poor reproductive or obstetric history, unspecified trimester: Secondary | ICD-10-CM | POA: Diagnosis not present

## 2023-12-07 DIAGNOSIS — O133 Gestational [pregnancy-induced] hypertension without significant proteinuria, third trimester: Secondary | ICD-10-CM | POA: Diagnosis not present

## 2023-12-07 DIAGNOSIS — O99213 Obesity complicating pregnancy, third trimester: Secondary | ICD-10-CM | POA: Diagnosis not present

## 2023-12-07 DIAGNOSIS — Z3A36 36 weeks gestation of pregnancy: Secondary | ICD-10-CM | POA: Diagnosis not present

## 2023-12-12 DIAGNOSIS — O99344 Other mental disorders complicating childbirth: Secondary | ICD-10-CM | POA: Diagnosis not present

## 2023-12-12 DIAGNOSIS — O24429 Gestational diabetes mellitus in childbirth, unspecified control: Secondary | ICD-10-CM | POA: Diagnosis not present

## 2023-12-12 DIAGNOSIS — Z3A37 37 weeks gestation of pregnancy: Secondary | ICD-10-CM | POA: Diagnosis not present

## 2023-12-12 DIAGNOSIS — O24420 Gestational diabetes mellitus in childbirth, diet controlled: Secondary | ICD-10-CM | POA: Diagnosis not present

## 2023-12-12 DIAGNOSIS — J45909 Unspecified asthma, uncomplicated: Secondary | ICD-10-CM | POA: Diagnosis not present

## 2023-12-12 DIAGNOSIS — F419 Anxiety disorder, unspecified: Secondary | ICD-10-CM | POA: Diagnosis not present

## 2023-12-12 DIAGNOSIS — F32A Depression, unspecified: Secondary | ICD-10-CM | POA: Diagnosis not present

## 2023-12-12 DIAGNOSIS — O9952 Diseases of the respiratory system complicating childbirth: Secondary | ICD-10-CM | POA: Diagnosis not present

## 2023-12-12 DIAGNOSIS — O99214 Obesity complicating childbirth: Secondary | ICD-10-CM | POA: Diagnosis not present

## 2023-12-12 DIAGNOSIS — O134 Gestational [pregnancy-induced] hypertension without significant proteinuria, complicating childbirth: Secondary | ICD-10-CM | POA: Diagnosis not present

## 2023-12-17 ENCOUNTER — Other Ambulatory Visit (HOSPITAL_BASED_OUTPATIENT_CLINIC_OR_DEPARTMENT_OTHER): Payer: Self-pay

## 2023-12-17 MED ORDER — IBUPROFEN 600 MG PO TABS
600.0000 mg | ORAL_TABLET | Freq: Four times a day (QID) | ORAL | 0 refills | Status: AC | PRN
  Filled 2023-12-17: qty 30, 8d supply, fill #0

## 2023-12-18 DIAGNOSIS — R0602 Shortness of breath: Secondary | ICD-10-CM | POA: Diagnosis not present

## 2023-12-18 DIAGNOSIS — O99345 Other mental disorders complicating the puerperium: Secondary | ICD-10-CM | POA: Diagnosis not present

## 2023-12-18 DIAGNOSIS — R0789 Other chest pain: Secondary | ICD-10-CM | POA: Diagnosis not present

## 2023-12-18 DIAGNOSIS — J45909 Unspecified asthma, uncomplicated: Secondary | ICD-10-CM | POA: Diagnosis not present

## 2023-12-18 DIAGNOSIS — Z79899 Other long term (current) drug therapy: Secondary | ICD-10-CM | POA: Diagnosis not present

## 2023-12-18 DIAGNOSIS — I509 Heart failure, unspecified: Secondary | ICD-10-CM | POA: Diagnosis not present

## 2023-12-18 DIAGNOSIS — J4520 Mild intermittent asthma, uncomplicated: Secondary | ICD-10-CM | POA: Diagnosis not present

## 2023-12-18 DIAGNOSIS — Z8759 Personal history of other complications of pregnancy, childbirth and the puerperium: Secondary | ICD-10-CM | POA: Diagnosis not present

## 2023-12-18 DIAGNOSIS — R109 Unspecified abdominal pain: Secondary | ICD-10-CM | POA: Diagnosis not present

## 2023-12-18 DIAGNOSIS — O9953 Diseases of the respiratory system complicating the puerperium: Secondary | ICD-10-CM | POA: Diagnosis not present

## 2023-12-18 DIAGNOSIS — J811 Chronic pulmonary edema: Secondary | ICD-10-CM | POA: Diagnosis not present

## 2023-12-18 DIAGNOSIS — O99893 Other specified diseases and conditions complicating puerperium: Secondary | ICD-10-CM | POA: Diagnosis not present

## 2023-12-18 DIAGNOSIS — R0781 Pleurodynia: Secondary | ICD-10-CM | POA: Diagnosis not present

## 2023-12-18 DIAGNOSIS — Z880 Allergy status to penicillin: Secondary | ICD-10-CM | POA: Diagnosis not present

## 2023-12-18 DIAGNOSIS — Z20822 Contact with and (suspected) exposure to covid-19: Secondary | ICD-10-CM | POA: Diagnosis not present

## 2023-12-18 DIAGNOSIS — R079 Chest pain, unspecified: Secondary | ICD-10-CM | POA: Diagnosis not present

## 2023-12-18 DIAGNOSIS — Z7951 Long term (current) use of inhaled steroids: Secondary | ICD-10-CM | POA: Diagnosis not present

## 2023-12-18 DIAGNOSIS — F419 Anxiety disorder, unspecified: Secondary | ICD-10-CM | POA: Diagnosis not present

## 2023-12-18 DIAGNOSIS — F32A Depression, unspecified: Secondary | ICD-10-CM | POA: Diagnosis not present

## 2023-12-18 DIAGNOSIS — O1415 Severe pre-eclampsia, complicating the puerperium: Secondary | ICD-10-CM | POA: Diagnosis not present

## 2024-01-24 ENCOUNTER — Other Ambulatory Visit (HOSPITAL_BASED_OUTPATIENT_CLINIC_OR_DEPARTMENT_OTHER): Payer: Self-pay
